# Patient Record
Sex: Female | Born: 1945 | Race: White | Hispanic: No | Marital: Married | Smoking: Never smoker
Health system: Southern US, Community
[De-identification: ages and names within clinical notes are randomized; demographics above are authoritative.]

## PROBLEM LIST (undated history)

## (undated) DIAGNOSIS — M2550 Pain in unspecified joint: Secondary | ICD-10-CM

## (undated) DIAGNOSIS — R351 Nocturia: Secondary | ICD-10-CM

## (undated) DIAGNOSIS — R102 Pelvic and perineal pain unspecified side: Secondary | ICD-10-CM

## (undated) DIAGNOSIS — R350 Frequency of micturition: Secondary | ICD-10-CM

## (undated) DIAGNOSIS — G8929 Other chronic pain: Secondary | ICD-10-CM

## (undated) DIAGNOSIS — M5030 Other cervical disc degeneration, unspecified cervical region: Secondary | ICD-10-CM

## (undated) DIAGNOSIS — I10 Essential (primary) hypertension: Secondary | ICD-10-CM

## (undated) DIAGNOSIS — Z8719 Personal history of other diseases of the digestive system: Secondary | ICD-10-CM

## (undated) DIAGNOSIS — M1990 Unspecified osteoarthritis, unspecified site: Secondary | ICD-10-CM

## (undated) DIAGNOSIS — K589 Irritable bowel syndrome without diarrhea: Secondary | ICD-10-CM

## (undated) DIAGNOSIS — R3915 Urgency of urination: Secondary | ICD-10-CM

## (undated) DIAGNOSIS — K219 Gastro-esophageal reflux disease without esophagitis: Secondary | ICD-10-CM

## (undated) HISTORY — PX: ABDOMINAL HYSTERECTOMY: SHX81

## (undated) HISTORY — PX: TONSILLECTOMY AND ADENOIDECTOMY: SUR1326

## (undated) HISTORY — PX: DILATION AND CURETTAGE OF UTERUS: SHX78

## (undated) HISTORY — PX: CARDIOVASCULAR STRESS TEST: SHX262

## (undated) HISTORY — PX: OTHER SURGICAL HISTORY: SHX169

---

## 1990-12-01 HISTORY — PX: CHOLECYSTECTOMY: SHX55

## 2001-06-18 ENCOUNTER — Encounter: Payer: Self-pay | Admitting: Family Medicine

## 2001-06-18 ENCOUNTER — Encounter: Admission: RE | Admit: 2001-06-18 | Discharge: 2001-06-18 | Payer: Self-pay | Admitting: Family Medicine

## 2002-01-20 ENCOUNTER — Ambulatory Visit (HOSPITAL_COMMUNITY): Admission: RE | Admit: 2002-01-20 | Discharge: 2002-01-20 | Payer: Self-pay | Admitting: Family Medicine

## 2002-06-23 ENCOUNTER — Encounter: Payer: Self-pay | Admitting: Family Medicine

## 2002-06-23 ENCOUNTER — Encounter: Admission: RE | Admit: 2002-06-23 | Discharge: 2002-06-23 | Payer: Self-pay | Admitting: Family Medicine

## 2003-06-26 ENCOUNTER — Encounter: Payer: Self-pay | Admitting: Gastroenterology

## 2003-06-26 ENCOUNTER — Encounter: Admission: RE | Admit: 2003-06-26 | Discharge: 2003-06-26 | Payer: Self-pay | Admitting: Gastroenterology

## 2003-07-11 ENCOUNTER — Ambulatory Visit (HOSPITAL_COMMUNITY): Admission: RE | Admit: 2003-07-11 | Discharge: 2003-07-11 | Payer: Self-pay | Admitting: Gastroenterology

## 2003-07-18 ENCOUNTER — Encounter: Payer: Self-pay | Admitting: Family Medicine

## 2003-07-18 ENCOUNTER — Encounter: Admission: RE | Admit: 2003-07-18 | Discharge: 2003-07-18 | Payer: Self-pay | Admitting: Family Medicine

## 2003-12-21 ENCOUNTER — Other Ambulatory Visit: Admission: RE | Admit: 2003-12-21 | Discharge: 2003-12-21 | Payer: Self-pay | Admitting: Obstetrics & Gynecology

## 2004-01-30 ENCOUNTER — Ambulatory Visit (HOSPITAL_COMMUNITY): Admission: RE | Admit: 2004-01-30 | Discharge: 2004-01-30 | Payer: Self-pay | Admitting: Orthopedic Surgery

## 2004-07-26 ENCOUNTER — Encounter: Admission: RE | Admit: 2004-07-26 | Discharge: 2004-07-26 | Payer: Self-pay | Admitting: Family Medicine

## 2004-09-25 ENCOUNTER — Encounter: Admission: RE | Admit: 2004-09-25 | Discharge: 2004-09-25 | Payer: Self-pay | Admitting: Gastroenterology

## 2005-08-07 ENCOUNTER — Encounter: Admission: RE | Admit: 2005-08-07 | Discharge: 2005-08-07 | Payer: Self-pay | Admitting: Gastroenterology

## 2006-02-24 ENCOUNTER — Encounter: Admission: RE | Admit: 2006-02-24 | Discharge: 2006-02-24 | Payer: Self-pay | Admitting: Family Medicine

## 2007-02-25 ENCOUNTER — Encounter: Admission: RE | Admit: 2007-02-25 | Discharge: 2007-02-25 | Payer: Self-pay | Admitting: Family Medicine

## 2007-08-27 ENCOUNTER — Encounter: Admission: RE | Admit: 2007-08-27 | Discharge: 2007-08-27 | Payer: Self-pay | Admitting: Family Medicine

## 2007-10-22 ENCOUNTER — Encounter: Admission: RE | Admit: 2007-10-22 | Discharge: 2007-10-22 | Payer: Self-pay | Admitting: Family Medicine

## 2007-12-02 HISTORY — PX: BASAL CELL CARCINOMA EXCISION: SHX1214

## 2008-03-29 ENCOUNTER — Encounter: Admission: RE | Admit: 2008-03-29 | Discharge: 2008-03-29 | Payer: Self-pay | Admitting: Family Medicine

## 2008-10-10 ENCOUNTER — Encounter: Admission: RE | Admit: 2008-10-10 | Discharge: 2008-11-29 | Payer: Self-pay | Admitting: Family Medicine

## 2008-12-04 ENCOUNTER — Encounter: Admission: RE | Admit: 2008-12-04 | Discharge: 2008-12-13 | Payer: Self-pay | Admitting: Family Medicine

## 2009-04-09 ENCOUNTER — Encounter: Admission: RE | Admit: 2009-04-09 | Discharge: 2009-04-09 | Payer: Self-pay | Admitting: Family Medicine

## 2009-06-18 ENCOUNTER — Encounter: Admission: RE | Admit: 2009-06-18 | Discharge: 2009-07-25 | Payer: Self-pay | Admitting: Family Medicine

## 2009-06-26 ENCOUNTER — Other Ambulatory Visit: Admission: RE | Admit: 2009-06-26 | Discharge: 2009-06-26 | Payer: Self-pay | Admitting: Family Medicine

## 2009-10-15 IMAGING — CT CT MAXILLOFACIAL W/O CM
1 of 2 series · 16 of 30 positions shown, 20 images · non-contrast
Comparison: None.

MAXILLOFACIAL CT WITHOUT CONTRAST:

CLINICAL DATA: Right maxillary and ear pain.
TECHNIQUE: Axial and coronal plane CT imaging of the maxillofacial structures
was performed including the facial bones, paranasal sinuses, and orbits.  No
intravenous contrast was administered.

[Series 2: sinus prone bone · axial · 0.33mm/px · z∈[-7,+117]mm · 16 of 52 slices shown, 20 images]
[im 3/52  brain]
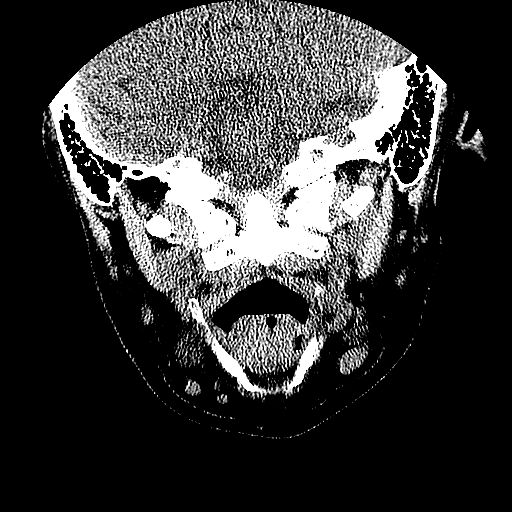
[im 3/52  bone]
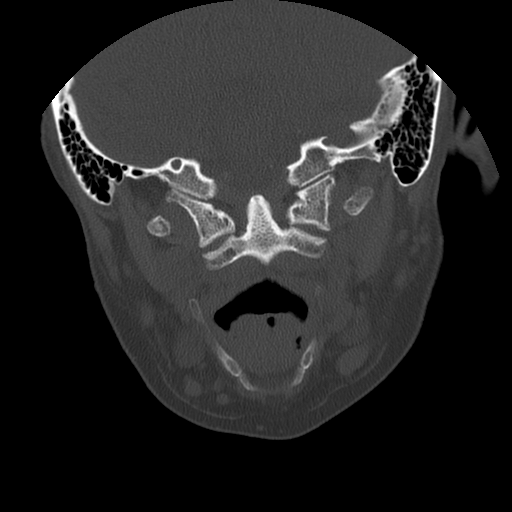
[im 7/52  bone]
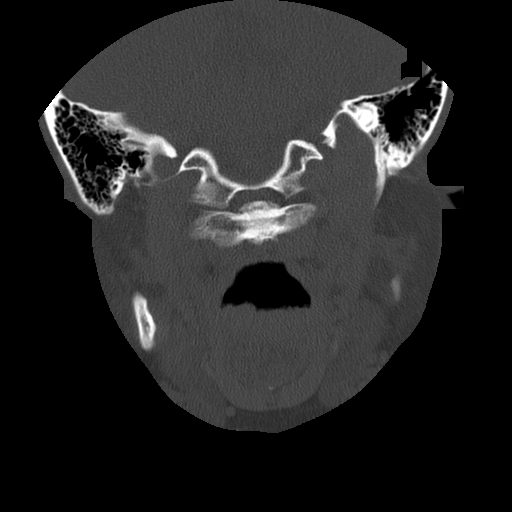
[im 9/52  bone]
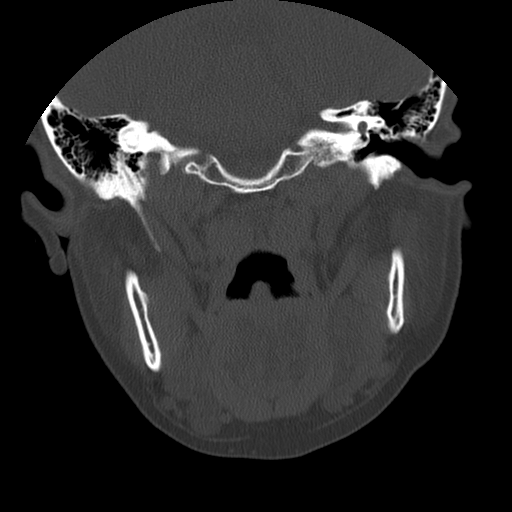
[im 13/52  bone]
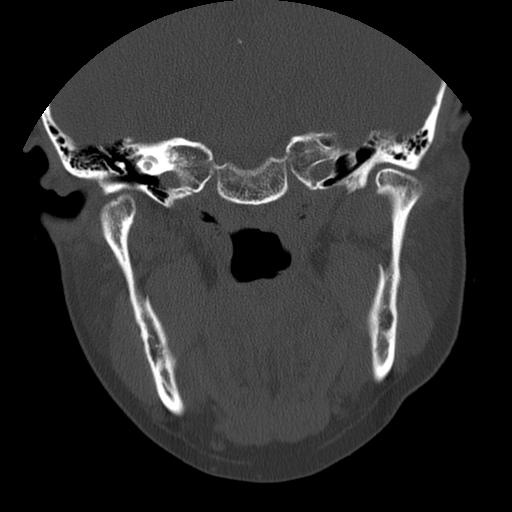
[im 15/52  brain]
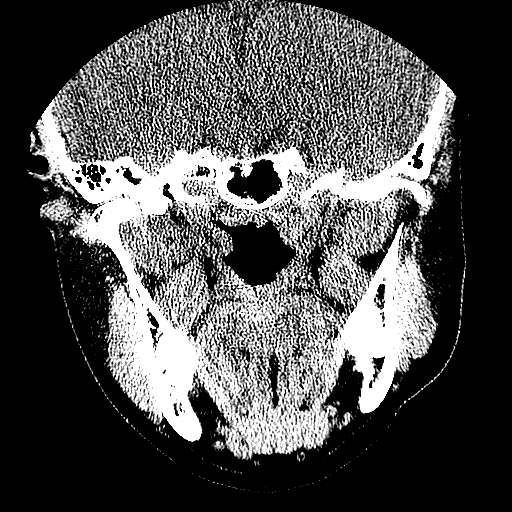
[im 15/52  bone]
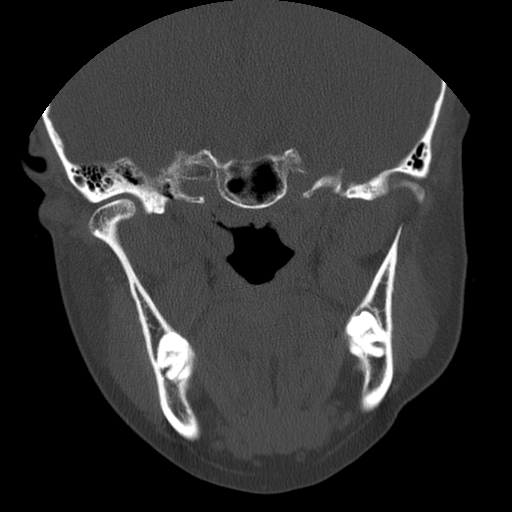
[im 19/52  bone]
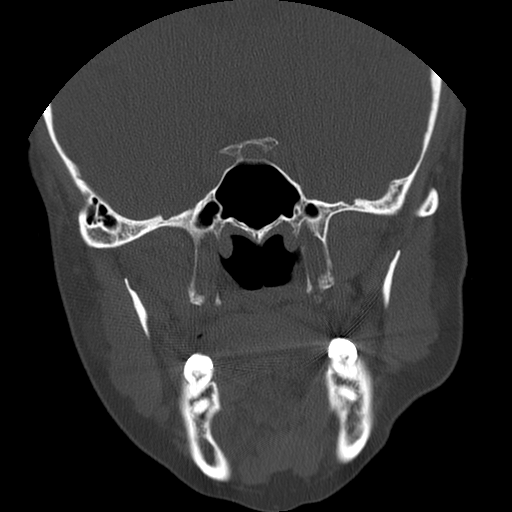
[im 21/52  bone]
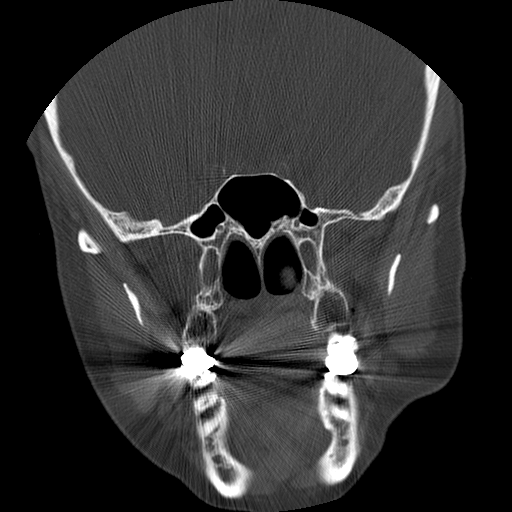
[im 25/52  bone]
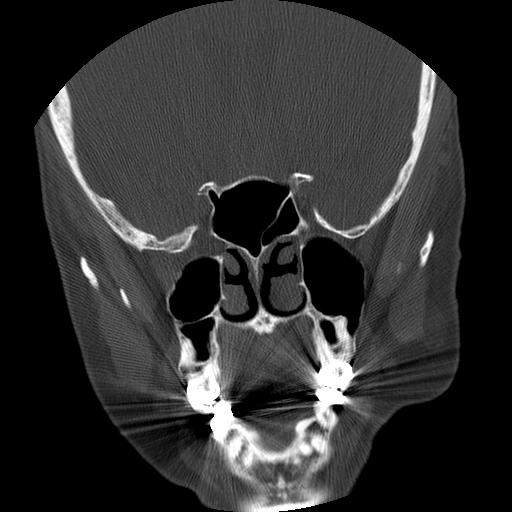
[im 27/52  brain]
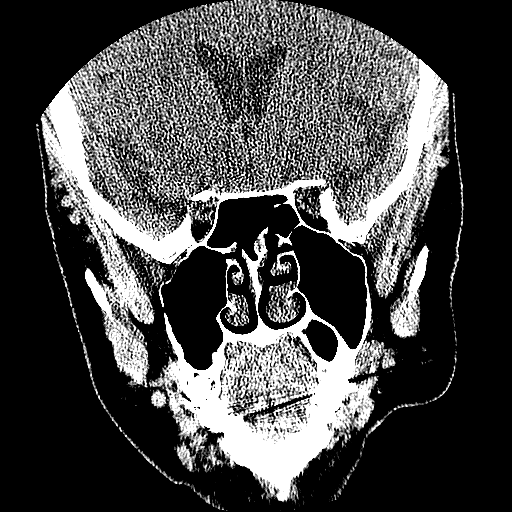
[im 27/52  bone]
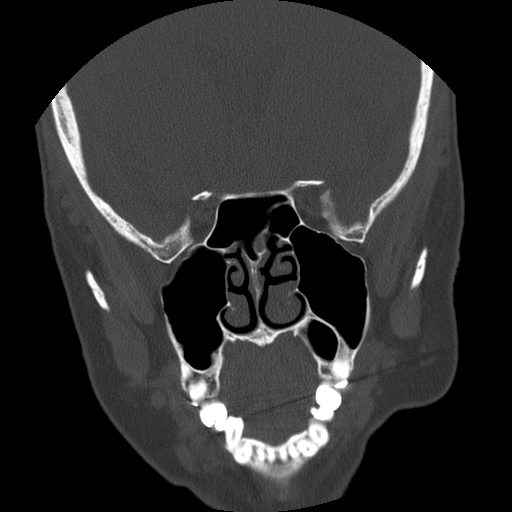
[im 31/52  bone]
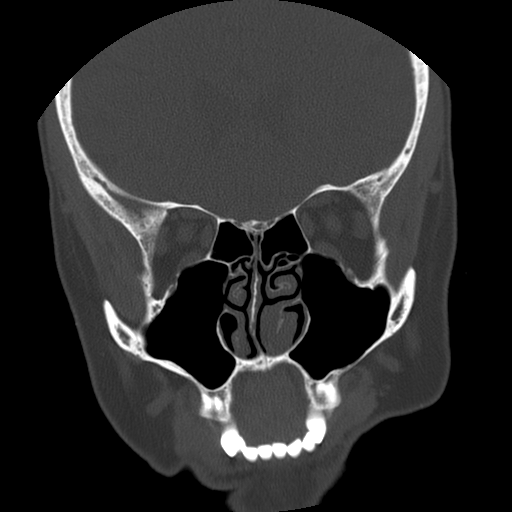
[im 33/52  bone]
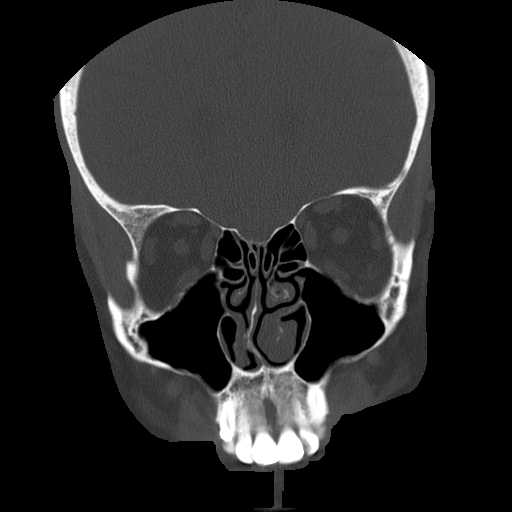
[im 37/52  bone]
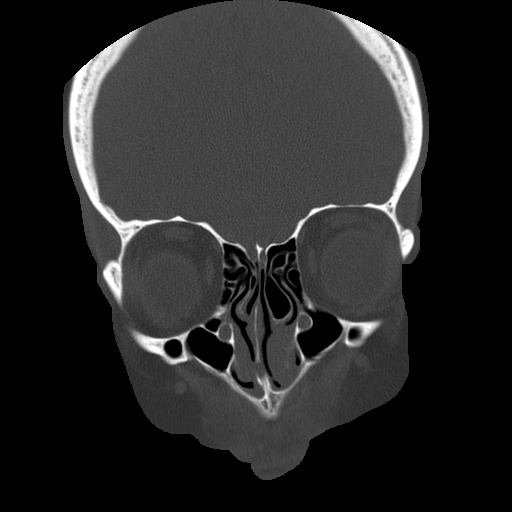
[im 39/52  brain]
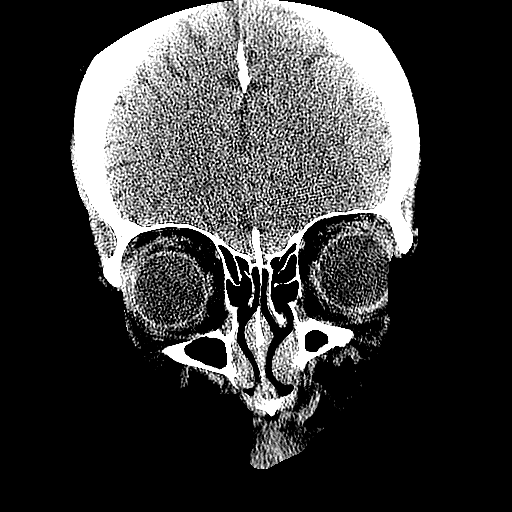
[im 39/52  bone]
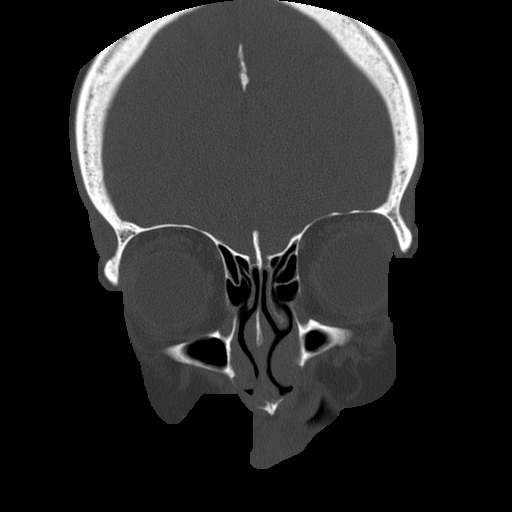
[im 43/52  bone]
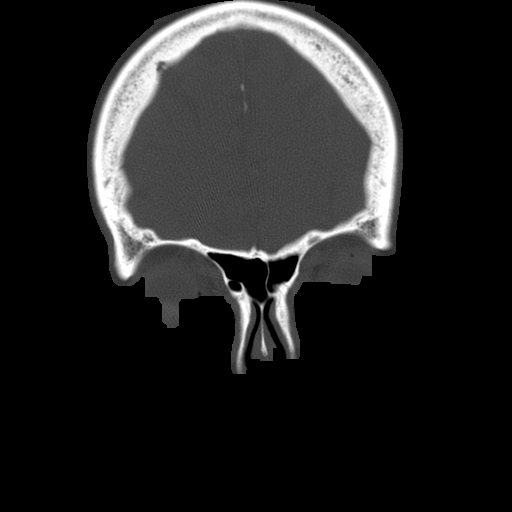
[im 45/52  bone]
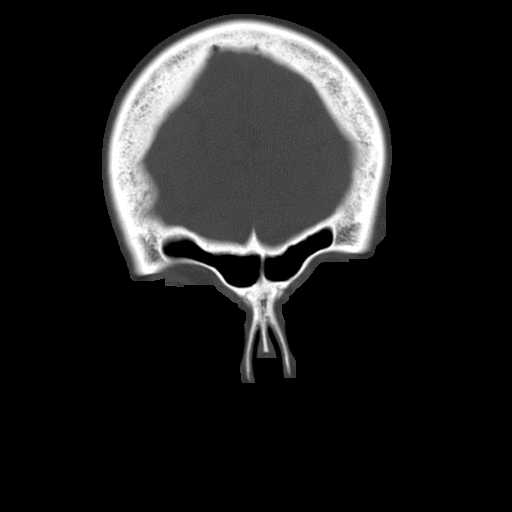
[im 49/52  bone]
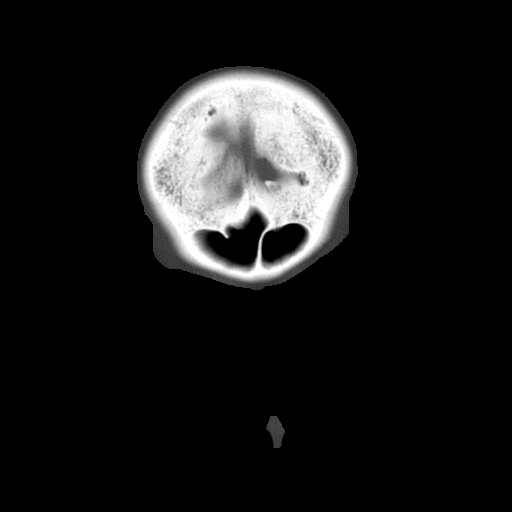

[16 of 30 positions shown; findings below may reference images not displayed]

FINDINGS: The sphenoid sinuses, maxillary sinuses, ethmoid air cells, and
frontal sinuses are clear. The visualized portions of the mastoid air cells are
without evidence for effusion. There is no fluid within the middle ears. The
temporomandibular joints are located bilaterally. A few small scattered
submental and submandibular lymph nodes are evident.
IMPRESSION: No evidence for paranasal sinus disease.

## 2010-03-01 ENCOUNTER — Encounter (INDEPENDENT_AMBULATORY_CARE_PROVIDER_SITE_OTHER): Payer: Self-pay | Admitting: Internal Medicine

## 2010-03-01 ENCOUNTER — Ambulatory Visit: Payer: Self-pay | Admitting: Cardiology

## 2010-03-01 ENCOUNTER — Observation Stay (HOSPITAL_COMMUNITY): Admission: EM | Admit: 2010-03-01 | Discharge: 2010-03-02 | Payer: Self-pay | Admitting: Emergency Medicine

## 2010-03-01 HISTORY — PX: TRANSTHORACIC ECHOCARDIOGRAM: SHX275

## 2010-03-07 ENCOUNTER — Telehealth (INDEPENDENT_AMBULATORY_CARE_PROVIDER_SITE_OTHER): Payer: Self-pay | Admitting: *Deleted

## 2010-03-11 ENCOUNTER — Encounter (HOSPITAL_COMMUNITY): Admission: RE | Admit: 2010-03-11 | Discharge: 2010-05-02 | Payer: Self-pay | Admitting: Cardiology

## 2010-03-11 ENCOUNTER — Ambulatory Visit: Payer: Self-pay | Admitting: Internal Medicine

## 2010-03-11 ENCOUNTER — Ambulatory Visit: Payer: Self-pay

## 2010-03-18 DIAGNOSIS — R079 Chest pain, unspecified: Secondary | ICD-10-CM

## 2010-03-18 DIAGNOSIS — I10 Essential (primary) hypertension: Secondary | ICD-10-CM | POA: Insufficient documentation

## 2010-03-20 ENCOUNTER — Encounter: Payer: Self-pay | Admitting: Cardiology

## 2010-03-21 ENCOUNTER — Ambulatory Visit: Payer: Self-pay | Admitting: Cardiology

## 2010-04-03 ENCOUNTER — Encounter: Admission: RE | Admit: 2010-04-03 | Discharge: 2010-05-29 | Payer: Self-pay | Admitting: Orthopedic Surgery

## 2010-07-12 ENCOUNTER — Encounter: Admission: RE | Admit: 2010-07-12 | Discharge: 2010-07-12 | Payer: Self-pay | Admitting: Family Medicine

## 2010-07-31 ENCOUNTER — Encounter: Admission: RE | Admit: 2010-07-31 | Discharge: 2010-07-31 | Payer: Self-pay | Admitting: Gastroenterology

## 2011-01-02 NOTE — Assessment & Plan Note (Signed)
Summary: Cardiology Nuclear Study  Nuclear Med Background Indications for Stress Test: Evaluation for Ischemia, Post Hospital  Indications Comments: 03/02/10 Chest Pain: MI R/O  History: Echo  History Comments: 03/01/10 Echo: EF=55-60%  Symptoms: Chest Pain, Chest Tightness with Exertion    Nuclear Pre-Procedure Cardiac Risk Factors: Hypertension Caffeine/Decaff Intake: None NPO After: 12:00 AM Lungs: clear IV 0.9% NS with Angio Cath: 22g     IV Site: (R) AC IV Started by: Irean Hong RN Chest Size (in) 38     Cup Size B     Height (in): 61.5 Weight (lb): 142 BMI: 26.49 Tech Comments: This patient had chest pressure and radiating pain to the (L) shoulder 5/10 at the worst.  Nuclear Med Study 1 or 2 day study:  1 day     Stress Test Type:  Eugenie Birks Reading MD:  Dietrich Pates, MD     Referring MD:  J.Katz Resting Radionuclide:  Technetium 75m Tetrofosmin     Resting Radionuclide Dose:  11.0 mCi  Stress Radionuclide:  Technetium 35m Tetrofosmin     Stress Radionuclide Dose:  33.0 mCi   Stress Protocol   Lexiscan: 0.4 mg   Stress Test Technologist:  Milana Na EMT-P     Nuclear Technologist:  Domenic Polite CNMT  Rest Procedure  Myocardial perfusion imaging was performed at rest 45 minutes following the intravenous administration of Myoview Technetium 75m Tetrofosmin.  Stress Procedure  The patient received IV Lexiscan 0.4 mg over 15-seconds.  Myoview injected at 30-seconds.  There were no significant changes with infusion.  Quantitative spect images were obtained after a 45 minute delay.  QPS Raw Data Images:  Images were motion corrected.  Soft tissue (diaphragm, breast tissue) surround heart. Stress Images:  There is normal uptake in all areas. Rest Images:  Normal homogeneous uptake in all areas of the myocardium. Subtraction (SDS):  No evidence of ischemia. Transient Ischemic Dilatation:  .72  (Normal <1.22)  Lung/Heart Ratio:  .47  (Normal  <0.45)  Quantitative Gated Spect Images QGS EDV:  34 ml QGS ESV:  6 ml QGS EF:  83 %   Overall Impression  Exercise Capacity: Lexiscan protocol. BP Response: Normal blood pressure response. Clinical Symptoms: Moderate chest pressure. ECG Impression: No significant ST segment change suggestive of ischemia. Overall Impression: Normal stress nuclear study.  Appended Document: Cardiology Nuclear Study Is there an office visit planned?  Appended Document: Cardiology Nuclear Study pt has appt 4/21

## 2011-01-02 NOTE — Miscellaneous (Signed)
  Clinical Lists Changes  Problems: Added new problem of * ARM NUMBNESS Observations: Added new observation of PAST MED HX: Hiatal hernia CHEST PAIN (ICD-786.50)  hospital 02/28/2010...no MI. /  nuclear outpatient..03/11/2010..normal...EF  70% HYPERTENSION (ICD-401.9) Arm numbness (left)..consider cervical spine ) EF 55-60%  echo..03/01/2010 (03/20/2010 11:31) Added new observation of PRIMARY MD: Carolin Coy, MD (03/20/2010 11:31)       Past History:  Past Medical History: Hiatal hernia CHEST PAIN (ICD-786.50)  hospital 02/28/2010...no MI. /  nuclear outpatient..03/11/2010..normal...EF  70% HYPERTENSION (ICD-401.9) Arm numbness (left)..consider cervical spine ) EF 55-60%  echo..03/01/2010

## 2011-01-02 NOTE — Progress Notes (Signed)
Summary: Nuclear Pre-Procedure  Phone Note Outgoing Call Call back at [facility] Phone [phone]   Call placed by: Stanton Kidney, EMT-P,  March 07, 2010 3:19 PM Call placed to: Patient Action Taken: Phone Call Completed Summary of Call: Reviewed information on Myoview Information Sheet (see scanned document for further details).  Spoke with Patient     Nuclear Med Background Indications for Stress Test: Evaluation for Ischemia, Post Hospital  Indications Comments: 03/02/10 Chest Pain: MI R/O  History: Echo  History Comments: 03/01/10 Echo: EF=55-60%  Symptoms: Chest Pain, Chest Tightness with Exertion    Nuclear Pre-Procedure Cardiac Risk Factors: Hypertension

## 2011-01-02 NOTE — Assessment & Plan Note (Signed)
Summary: EPH/PER NIGHT MESSAGE/POST MYOVIEW/LG   Visit Type:  post hospital Primary Provider:  Hall Busing, MD  CC:  chest pain.  History of Present Illness: The patient had been admitted to the hospital with some chest discomfort.  This occurred on February 28, 2010.  There was no evidence of myocardial injury.  Patient was discharged home with plans for an outpatient exercise test.  She had this study on March 11, 2010 she had a [employer]..  The study was normal.  There was no sign of ischemia.  She had had an echo in the hospital showing an ejection fraction of 60% with no valvular abnormalities.  There was question in the hospital that she might have some disc disease.  As being at home she's had some difficulties with pain in her back that radiates down her leg and she has been seen by orthopedics and she is on prednisone.  Current Medications (verified): 1)  Vasotec 20 Mg Tabs (Enalapril Maleate) .... Take 1 Tablet By Mouth Once A Day 2)  Hydrochlorothiazide 25 Mg Tabs (Hydrochlorothiazide) .... Take One Tablet By Mouth Daily. 3)  Nexium 40 Mg Cpdr (Esomeprazole Magnesium) .... Take 1 Capsule By Mouth Once A Day 4)  Aspirin 81 Mg Tbec (Aspirin) .... Take One Tablet By Mouth Daily 5)  Calcium Carbonate-Vitamin D 600-400 Mg-Unit  Tabs (Calcium Carbonate-Vitamin D) .... Take 1 Tablet By Mouth Once A Day 6)  Daily Multi  Tabs (Multiple Vitamins-Minerals) .... Take 1 Tablet By Mouth Once A Day 7)  Glucosamine Chondr 1500 Complx  Caps (Glucosamine-Chondroit-Vit C-Mn) .... Take 1 Capsule By Mouth Once A Day 8)  Ambien 10 Mg Tabs (Zolpidem Tartrate) .Marland Kitchen.. 1 or 1/2 Tablet As Needed 9)  Estrace 0.1 Mg/gm Crea (Estradiol) .... Once A Week 10)  Prednisone 10 Mg Tabs (Prednisone) .... Take As Directed  Allergies: 1)  ! Tetracycline 2)  ! * Azithromycin 3)  ! Amoxicillin 4)  ! Morphine 5)  ! * Levofloxacin 6)  ! * Benzonatate 7)  ! * Guaifenesin 8)  ! Codeine 9)  ! * Imipramine 10)   ! Synthroid 11)  ! Ceftin 12)  ! * Tessalon 13)  ! Caffeine  Past History:  Past Medical History: Last updated: 03/20/2010 Hiatal hernia CHEST PAIN (ICD-786.50)  hospital 02/28/2010...no MI. /  nuclear outpatient..03/11/2010..normal...EF  70% HYPERTENSION (ICD-401.9) Arm numbness (left)..consider cervical spine ) EF 55-60%  echo..03/01/2010  Review of Systems       Patient denies fever, chills, headache, sweats, rash, change in vision, change in hearing, chest pain, cough, nausea vomiting, urinary symptoms.  All other systems are reviewed and are negative.  Vital Signs:  Patient profile:   65 year old female Height:      61.5 inches Weight:      146.25 pounds BMI:     27.28 Pulse rate:   88 / minute Pulse rhythm:   regular Resp:     18 per minute BP sitting:   110 / 70  (left arm) Cuff size:   regular  Vitals Entered By: Vikki Ports (March 21, 2010 2:17 PM)  Physical Exam  General:  patient is stable today. Eyes:  no xanthelasma. Neck:  no jugular venous distention. Lungs:  lungs are clear.  Respiratory effort is nonlabored. Heart:  cardiac exam reveals S1 and S2.  No clicks or significant murmurs. Abdomen:  abdomen is soft. Extremities:  no peripheral edema. Psych:  patient is oriented to person time and place.  Affect  is normal.   Impression & Recommendations:  Problem # 1:  * ARM NUMBNESS The patient may have some disc disease.  This is being evaluated for both arm discomfort and leg discomfort  Problem # 2:  CHEST PAIN (ICD-786.50)  Her updated medication list for this problem includes:    Vasotec 20 Mg Tabs (Enalapril maleate) .Marland Kitchen... Take 1 tablet by mouth once a day    Aspirin 81 Mg Tbec (Aspirin) .Marland Kitchen... Take one tablet by mouth daily Nuclear study shows no evidence of ischemic disease.  No further cardiac workup is needed.  Problem # 3:  HYPERTENSION (ICD-401.9)  Her updated medication list for this problem includes:    Vasotec 20 Mg Tabs (Enalapril  maleate) .Marland Kitchen... Take 1 tablet by mouth once a day    Hydrochlorothiazide 25 Mg Tabs (Hydrochlorothiazide) .Marland Kitchen... Take one tablet by mouth daily.    Aspirin 81 Mg Tbec (Aspirin) .Marland Kitchen... Take one tablet by mouth daily Blood pressure is under good control.  No further workup needed.

## 2011-02-19 LAB — COMPREHENSIVE METABOLIC PANEL
AST: 26 U/L (ref 0–37)
Albumin: 3.7 g/dL (ref 3.5–5.2)
Alkaline Phosphatase: 42 U/L (ref 39–117)
Creatinine, Ser: 0.78 mg/dL (ref 0.4–1.2)
GFR calc Af Amer: >60 mL/min (ref >60)
Glucose, Bld: 91 mg/dL (ref 70–99)

## 2011-02-19 LAB — CBC
HCT: 34.1 % — ABNORMAL LOW (ref 36.0–46.0)
Hemoglobin: 11.7 g/dL — ABNORMAL LOW (ref 12.0–15.0)
MCHC: 34.3 g/dL (ref 30.0–36.0)
MCV: 85.2 fL (ref 78.0–100.0)
Platelets: 242 10*3/uL (ref 150–400)
RBC: 4.01 MIL/uL (ref 3.87–5.11)
RDW: 13.4 % (ref 11.5–15.5)
WBC: 6.9 10*3/uL (ref 4.0–10.5)

## 2011-02-19 LAB — CARDIAC PANEL(CRET KIN+CKTOT+MB+TROPI)
CK, MB: 1.3 ng/mL (ref 0.3–4.0)
Relative Index: INVALID (ref 0.0–2.5)
Total CK: 57 U/L (ref 7–177)
Troponin I: <0.01 ng/mL (ref 0.00–0.06)
Troponin I: <0.01 ng/mL (ref 0.00–0.06)

## 2011-02-19 LAB — POCT CARDIAC MARKERS

## 2011-02-24 LAB — URINALYSIS, ROUTINE W REFLEX MICROSCOPIC
Bilirubin Urine: NEGATIVE
Glucose, UA: NEGATIVE mg/dL
Ketones, ur: NEGATIVE mg/dL
Nitrite: NEGATIVE
Protein, ur: NEGATIVE mg/dL
Specific Gravity, Urine: 1.01 (ref 1.005–1.030)
Urobilinogen, UA: 0.2 mg/dL (ref 0.0–1.0)

## 2011-02-24 LAB — POCT CARDIAC MARKERS
CKMB, poc: 1 ng/mL (ref 1.0–8.0)
Myoglobin, poc: 81.7 ng/mL (ref 12–200)

## 2011-02-24 LAB — HEPATIC FUNCTION PANEL
AST: 31 U/L (ref 0–37)
Albumin: 4.2 g/dL (ref 3.5–5.2)
Total Bilirubin: 0.6 mg/dL (ref 0.3–1.2)
Total Protein: 7.9 g/dL (ref 6.0–8.3)

## 2011-02-24 LAB — DIFFERENTIAL
Basophils Absolute: 0 10*3/uL (ref 0.0–0.1)
Basophils Relative: 1 % (ref 0–1)
Eosinophils Absolute: 0.1 10*3/uL (ref 0.0–0.7)
Eosinophils Relative: 1 % (ref 0–5)
Lymphocytes Relative: 35 % (ref 12–46)
Monocytes Absolute: 0.6 10*3/uL (ref 0.1–1.0)
Monocytes Relative: 7 % (ref 3–12)

## 2011-02-24 LAB — CBC
Hemoglobin: 12.8 g/dL (ref 12.0–15.0)
RBC: 4.43 MIL/uL (ref 3.87–5.11)
WBC: 8.6 10*3/uL (ref 4.0–10.5)

## 2011-02-24 LAB — D-DIMER, QUANTITATIVE: D-Dimer, Quant: 0.28 ug/mL-FEU (ref 0.00–0.48)

## 2011-02-24 LAB — POCT I-STAT, CHEM 8
Creatinine, Ser: 0.9 mg/dL (ref 0.4–1.2)
HCT: 40 % (ref 36.0–46.0)
Hemoglobin: 13.6 g/dL (ref 12.0–15.0)
Potassium: 3.8 mEq/L (ref 3.5–5.1)
Sodium: 134 mEq/L — ABNORMAL LOW (ref 135–145)

## 2011-02-24 LAB — LIPASE, BLOOD: Lipase: 33 U/L (ref 11–59)

## 2011-02-24 LAB — URINE CULTURE

## 2011-04-18 NOTE — Op Note (Signed)
   NAME:  Tamara Aguilar, Tamara Aguilar                           ACCOUNT NO.:  [id number]   MEDICAL RECORD NO.:  [id number]                   PATIENT TYPE:  AMB   LOCATION:  ENDO                                 FACILITY[facility]  [facility]   PHYSICIAN:  James L. Malon Kindle., M.D.          DATE OF BIRTH:  11/29/1946   DATE OF PROCEDURE:  07/11/2003  DATE OF DISCHARGE:                                 OPERATIVE REPORT   PROCEDURE:  Esophagogastroduodenoscopy.   MEDICATIONS:  Fentanyl 75 mcg, Versed 7 mg.   INDICATIONS FOR PROCEDURE:  A patient with continued upper abdominal pain.  Upper GI showed a large hiatal hernia. It is unclear if there was a  paraesophageal component or not. Due to her continuing symptoms, failure to  respond, endoscopy is performed.   DESCRIPTION OF PROCEDURE:  The procedure had been explained to the patient  and consent obtained. With the patient in the left lateral decubitus  position, the Olympus scope was inserted and advanced. The stomach was  entered, pylorus identified and passed. The duodenum including the bulb and  second portion were seen well. There was no inflammation or ulceration. The  scope was withdrawn back in the stomach, the antrum was normal. The scope  was placed in the retroflexed view. Again the patient was seen to have a  massive hiatal hernia. This was documented photographically. Upon  withdrawal, the diaphragm was seen at 34 cm and the Z line at 30 cm. It was  just 4-5 cm in length. The diaphragm was widely patent. It is estimated  approximately a third of the stomach was up in the chest. I could not  demonstrate in endoscopy a paraesophageal component. The proximal and distal  esophagus were endoscopically normal upon withdrawal of the scope. The scope  was withdrawn. The patient tolerated the procedure well.   ASSESSMENT:  Large hiatal hernia with a partially intrathoracic stomach.   PLAN:  Will discuss surgical options with her, keep her on Aciphex now.  Proceed with colonoscopy as planned.                                               James L. Malon Kindle., M.D.    Waldron Session  D:  07/11/2003  T:  07/11/2003  Job:  [id number]   cc:   Otilio Connors. Gerri Spore, M.D.  [address]  [place]  [place] [address]  Fax: [phone]

## 2012-06-28 ENCOUNTER — Other Ambulatory Visit: Payer: Self-pay | Admitting: Urology

## 2012-07-01 ENCOUNTER — Encounter (HOSPITAL_BASED_OUTPATIENT_CLINIC_OR_DEPARTMENT_OTHER): Payer: Self-pay | Admitting: *Deleted

## 2012-07-01 NOTE — Progress Notes (Signed)
NPO AFTER MN. ARRIVES AT 0645. NEEDS ISTAT AND EKG. WILL TAKE TRAMADOL, TYLENOL ARTHRITIS AND NEXIUM AM OF SURG W/ SIPS OF WATER.

## 2012-07-07 NOTE — H&P (Signed)
History of Present Illness   [name] is a new patient originally followed by Dr. Aldean Aguilar. The last few months she was having right lower quadrant suprapubic discomfort that is quite steady. It may be relieved some when she voids. AZO Standard twice a day helped her void less frequently and helps the discomfort. She is not having dysuria. She was checked for a urine infection and she did not have one.  She has uncommon urge incontinence not normally wearing pads. She voids every hour but will take AZO Standard to try to hold it longer. She gets up 3-4 times a night and reports a poor flow.  She used to get urinary tract infections. She has had a bladder suspension and hysterectomy. She denies a history of kidney stones. She has no neurologic risk factors or symptoms. She has alternating diarrhea and constipation from irritable bowel syndrome.   Her symptoms are mild to moderate in severity and persisting.    Past Medical History Problems  1. History of  Anxiety (Symptom) 300.00 2. History of  Arthritis V13.4 3. History of  Depression 311 4. History of  Heartburn 787.1 5. History of  Hypercholesterolemia 272.0 6. History of  Hypertension 401.9 7. History of  Skin Cancer V10.83  Surgical History Problems  1. History of  Cholecystectomy 2. History of  Dilation And Curettage 3. History of  Hysterectomy V45.77 4. History of  Oophorectomy - Bilateral (Removal Of Both Ovaries) 5. History of  Tonsillectomy  Current Meds 1. Ambien TABS; Therapy: (Recorded:02Oct2008) to 2. Aspirin Low Dose 81 MG Oral Tablet; Therapy: (Recorded:02Oct2008) to 3. Calcium + D TABS; Therapy: (Recorded:02Oct2008) to 4. Estrace 0.1 MG/GM Vaginal Cream; Therapy: (Recorded:02Oct2008) to 5. Fluticasone Propionate 50 MCG/ACT Nasal Suspension; Therapy: 02Jul2012 to 6. Glucosamine Chondroitin TABS; Therapy: (Recorded:02Oct2008) to 7. Hydrochlorothiazide 25 MG Oral Tablet; Therapy: 12Apr2013 to 8.  Hydrocodone-Acetaminophen 5-500 MG Oral Tablet; Therapy: 01Apr2013 to 9. Lexapro TABS; Therapy: (Recorded:02Oct2008) to 10. NexIUM 40 MG Oral Capsule Delayed Release; Therapy: 29Nov2012 to 11. TraMADol HCl 50 MG Oral Tablet; Therapy: 05Feb2013 to 12. Vasotec 20 MG Oral Tablet; Therapy: (Recorded:02Oct2008) to 13. Voltaren 1 % Transdermal Gel; Therapy: 26Jun2013 to 14. Zolpidem Tartrate 10 MG Oral Tablet; Therapy: 30Jan2013 to  Allergies Medication  1. Acetaminophen TABS 2. Amoxicillin TABS 3. Azithromycin TABS 4. Caffeine TABS 5. Ceftin TABS 6. Guaifenesin & Derivatives 7. Imipramine HCl TABS 8. Levaquin TABS 9. Morphine Derivatives 10. Synthroid TABS 11. Tessalon Perles CAPS 12. Tetracyclines  Family History Problems  1. Maternal history of  Acute Myocardial Infarction V17.3 2. Family history of  Death In The Family Father 3. Family history of  Family Health Status Number Of Children 1 son & 1 daughter 4. Maternal history of  Hematuria 5. Paternal history of  Prostate Cancer V16.42  Social History Problems    Caffeine Use 1 per day   Marital History - Currently Married   Never A Smoker   Occupation: admin. assoc advanced- [employer] Denied    History of  Alcohol Use   History of  Tobacco Use V15.82  Review of Systems Constitutional, skin, eye, cardiovascular, pulmonary, endocrine, musculoskeletal, neurological and psychiatric system(s) were reviewed and pertinent findings if present are noted.  Genitourinary: urinary frequency.  Gastrointestinal: abdominal pain, diarrhea and constipation.  ENT: sinus problems.  Hematologic/Lymphatic: a tendency to easily bruise.    Vitals Vital Signs [Data Includes: Last 1 Day]  29Jul2013 08:24AM  BMI Calculated: 27.82 BSA Calculated: 1.58 Height: 4 ft 11 in Weight:  138 lb  Blood Pressure: 127 / 81 Temperature: 97.8 F Heart Rate: 77  Physical Exam Constitutional: Well nourished and well developed . No acute distress.    ENT:. The ears and nose are normal in appearance.  Neck: The appearance of the neck is normal and no neck mass is present.  Pulmonary: No respiratory distress and normal respiratory rhythm and effort.  Cardiovascular: Heart rate and rhythm are normal . No peripheral edema.  Abdomen: The abdomen is soft and nontender. No masses are palpated. No CVA tenderness. No hernias are palpable. No hepatosplenomegaly noted.  Lymphatics: The femoral and inguinal nodes are not enlarged or tender.  Skin: Normal skin turgor, no visible rash and no visible skin lesions.  Neuro/Psych:. Mood and affect are appropriate.   . Genitourinary: On pelvic examination [name] had a grade 1 cystocele.  She had no stress incontinence.  Right levator muscles and bladder were minimally tender.  Left levator muscles were nontender.  She had whitish discharge which she says is normal for her.     Results/Data    Today she underwent a number of tests which I personally reviewed. Urinalysis: Negative.  Bladder scan: Bladder scan residual was 48 mL. Urine [Data Includes: Last 1 Day]   29Jul2013  COLOR YELLOW   APPEARANCE CLEAR   SPECIFIC GRAVITY 1.015   pH 6.0   GLUCOSE NEG mg/dL  BILIRUBIN NEG   KETONE NEG mg/dL  BLOOD NEG   PROTEIN NEG mg/dL  UROBILINOGEN 0.2 mg/dL  NITRITE NEG   LEUKOCYTE ESTERASE NEG    Assessment Assessed  1. Chronic Cystitis 595.2 2. Urge Incontinence Of Urine 788.31  Plan   Discussion/Summary   [name] has nonspecific suprapubic and right lower quadrant pain. She has frequency and nocturia. She has decreased flow. AZO Standard helped some of her symptoms. She has been followed by Dr. Aldean Aguilar in the past for urinary tract infections. She has mild urge incontinence not normally wearing pads. She understands she could have interstitial cystitis. I discussed a hydrodistension with her.   We talked about cystoscopy/hydrodistension and instillation in detail. Pros, cons, general  surgical and anesthetic risks, and other options including watchful waiting were discussed. Risks were described but not limited to pain, infection, and bleeding. The risk of bladder perforation and management were discussed. The patient understands that it is primarily a diagnostic procedure.   Ms. Willow uses Estrace cream once a week for vaginal dryness. She saw Dr. Clementeen Graham in [facility] and was hydrodistended 3 years ago to look for interstitial cystitis. She would like to proceed with a hydrodistension.  After a thorough review of the management options for the patient's condition the patient  elected to proceed with surgical therapy as noted above. We have discussed the potential benefits and risks of the procedure, side effects of the proposed treatment, the likelihood of the patient achieving the goals of the procedure, and any potential problems that might occur during the procedure or recuperation. Informed consent has been obtained.

## 2012-07-08 ENCOUNTER — Encounter (HOSPITAL_BASED_OUTPATIENT_CLINIC_OR_DEPARTMENT_OTHER)
Admission: RE | Disposition: A | Discharge status: Home / Self Care | Payer: Self-pay | Source: Ambulatory Visit | Attending: Urology

## 2012-07-08 ENCOUNTER — Ambulatory Visit (HOSPITAL_BASED_OUTPATIENT_CLINIC_OR_DEPARTMENT_OTHER)
Admission: RE | Admit: 2012-07-08 | Discharge: 2012-07-08 | Disposition: A | Discharge status: Home / Self Care | Payer: BC Managed Care – PPO | Source: Ambulatory Visit | Attending: Urology | Admitting: Urology

## 2012-07-08 ENCOUNTER — Ambulatory Visit (HOSPITAL_BASED_OUTPATIENT_CLINIC_OR_DEPARTMENT_OTHER): Payer: BC Managed Care – PPO | Admitting: Anesthesiology

## 2012-07-08 ENCOUNTER — Encounter (HOSPITAL_BASED_OUTPATIENT_CLINIC_OR_DEPARTMENT_OTHER): Payer: Self-pay | Admitting: *Deleted

## 2012-07-08 ENCOUNTER — Other Ambulatory Visit: Payer: Self-pay

## 2012-07-08 ENCOUNTER — Encounter (HOSPITAL_BASED_OUTPATIENT_CLINIC_OR_DEPARTMENT_OTHER): Payer: Self-pay | Admitting: Anesthesiology

## 2012-07-08 DIAGNOSIS — I10 Essential (primary) hypertension: Secondary | ICD-10-CM | POA: Insufficient documentation

## 2012-07-08 DIAGNOSIS — E7800 Pure hypercholesterolemia, unspecified: Secondary | ICD-10-CM | POA: Insufficient documentation

## 2012-07-08 DIAGNOSIS — Z85828 Personal history of other malignant neoplasm of skin: Secondary | ICD-10-CM | POA: Insufficient documentation

## 2012-07-08 DIAGNOSIS — N3020 Other chronic cystitis without hematuria: Secondary | ICD-10-CM | POA: Insufficient documentation

## 2012-07-08 DIAGNOSIS — N949 Unspecified condition associated with female genital organs and menstrual cycle: Secondary | ICD-10-CM | POA: Insufficient documentation

## 2012-07-08 DIAGNOSIS — Z79899 Other long term (current) drug therapy: Secondary | ICD-10-CM | POA: Insufficient documentation

## 2012-07-08 DIAGNOSIS — N3941 Urge incontinence: Secondary | ICD-10-CM | POA: Insufficient documentation

## 2012-07-08 HISTORY — PX: CYSTO WITH HYDRODISTENSION: SHX5453

## 2012-07-08 HISTORY — DX: Other cervical disc degeneration, unspecified cervical region: M50.30

## 2012-07-08 HISTORY — DX: Frequency of micturition: R35.0

## 2012-07-08 HISTORY — DX: Urgency of urination: R39.15

## 2012-07-08 HISTORY — DX: Irritable bowel syndrome, unspecified: K58.9

## 2012-07-08 HISTORY — DX: Personal history of other diseases of the digestive system: Z87.19

## 2012-07-08 HISTORY — DX: Unspecified osteoarthritis, unspecified site: M19.90

## 2012-07-08 HISTORY — DX: Nocturia: R35.1

## 2012-07-08 HISTORY — DX: Pelvic and perineal pain: R10.2

## 2012-07-08 HISTORY — DX: Pain in unspecified joint: M25.50

## 2012-07-08 HISTORY — DX: Essential (primary) hypertension: I10

## 2012-07-08 HISTORY — DX: Other chronic pain: G89.29

## 2012-07-08 HISTORY — DX: Gastro-esophageal reflux disease without esophagitis: K21.9

## 2012-07-08 HISTORY — DX: Pelvic and perineal pain unspecified side: R10.20

## 2012-07-08 LAB — POCT I-STAT 4, (NA,K, GLUC, HGB,HCT)
Hemoglobin: 13.3 g/dL (ref 12.0–15.0)
Potassium: 3.7 mEq/L (ref 3.5–5.1)

## 2012-07-08 SURGERY — CYSTOSCOPY, WITH BLADDER HYDRODISTENSION
Anesthesia: Monitor Anesthesia Care | Site: Bladder | Wound class: Clean Contaminated

## 2012-07-08 MED ORDER — GENTAMICIN IN SALINE 1.6-0.9 MG/ML-% IV SOLN
80.0000 mg | INTRAVENOUS | Status: AC
  Administered 2012-07-08: 80 mg via INTRAVENOUS

## 2012-07-08 MED ORDER — PROPOFOL 10 MG/ML IV EMUL
INTRAVENOUS | Status: DC | PRN
  Administered 2012-07-08: 130 mg via INTRAVENOUS

## 2012-07-08 MED ORDER — MIDAZOLAM HCL 5 MG/5ML IJ SOLN
INTRAMUSCULAR | Status: DC | PRN
  Administered 2012-07-08: 1 mg via INTRAVENOUS

## 2012-07-08 MED ORDER — FENTANYL CITRATE 0.05 MG/ML IJ SOLN: 25.0000 ug | INTRAMUSCULAR | Status: DC | PRN

## 2012-07-08 MED ORDER — PROMETHAZINE HCL 25 MG/ML IJ SOLN
6.2500 mg | INTRAMUSCULAR | Status: DC | PRN
  Administered 2012-07-08: 6.25 mg via INTRAVENOUS

## 2012-07-08 MED ORDER — ONDANSETRON HCL 4 MG/2ML IJ SOLN
INTRAMUSCULAR | Status: DC | PRN
  Administered 2012-07-08 (×2): 4 mg via INTRAVENOUS

## 2012-07-08 MED ORDER — FENTANYL CITRATE 0.05 MG/ML IJ SOLN
INTRAMUSCULAR | Status: DC | PRN
  Administered 2012-07-08: 50 ug via INTRAVENOUS

## 2012-07-08 MED ORDER — STERILE WATER FOR IRRIGATION IR SOLN
Status: DC | PRN
  Administered 2012-07-08: 3000 mL

## 2012-07-08 MED ORDER — DEXAMETHASONE SODIUM PHOSPHATE 4 MG/ML IJ SOLN
INTRAMUSCULAR | Status: DC | PRN
  Administered 2012-07-08: 10 mg via INTRAVENOUS

## 2012-07-08 MED ORDER — MEPERIDINE HCL 25 MG/ML IJ SOLN: 6.2500 mg | INTRAMUSCULAR | Status: DC | PRN

## 2012-07-08 MED ORDER — LACTATED RINGERS IV SOLN
INTRAVENOUS | Status: DC
  Administered 2012-07-08: 07:00:00 via INTRAVENOUS

## 2012-07-08 MED ORDER — LIDOCAINE HCL (CARDIAC) 20 MG/ML IV SOLN
INTRAVENOUS | Status: DC | PRN
  Administered 2012-07-08: 50 mg via INTRAVENOUS

## 2012-07-08 MED ORDER — SUCCINYLCHOLINE CHLORIDE 20 MG/ML IJ SOLN
INTRAMUSCULAR | Status: DC | PRN
  Administered 2012-07-08: 70 mg via INTRAVENOUS

## 2012-07-08 MED ORDER — PHENAZOPYRIDINE HCL 200 MG PO TABS
ORAL | Status: DC | PRN
  Administered 2012-07-08: 09:00:00 via INTRAVESICAL

## 2012-07-08 MED ORDER — GLYCOPYRROLATE 0.2 MG/ML IJ SOLN
INTRAMUSCULAR | Status: DC | PRN
  Administered 2012-07-08: 0.2 mg via INTRAVENOUS

## 2012-07-08 MED ORDER — LACTATED RINGERS IV SOLN
INTRAVENOUS | Status: DC
  Administered 2012-07-08: 09:00:00 via INTRAVENOUS

## 2012-07-08 SURGICAL SUPPLY — 22 items
BAG DRAIN URO-CYSTO SKYTR STRL (DRAIN) ×2 IMPLANT
CANISTER SUCT LVC 12 LTR MEDI- (MISCELLANEOUS) ×2 IMPLANT
CATH FOLEY 2WAY SLVR  5CC 18FR (CATHETERS)
CATH FOLEY 2WAY SLVR 5CC 18FR (CATHETERS) IMPLANT
CATH ROBINSON RED A/P 12FR (CATHETERS) IMPLANT
CATH ROBINSON RED A/P 14FR (CATHETERS) ×2 IMPLANT
CLOTH BEACON ORANGE TIMEOUT ST (SAFETY) ×2 IMPLANT
DRAPE CAMERA CLOSED 9X96 (DRAPES) ×2 IMPLANT
ELECT REM PT RETURN 9FT ADLT (ELECTROSURGICAL)
ELECTRODE REM PT RTRN 9FT ADLT (ELECTROSURGICAL) IMPLANT
GLOVE BIO SURGEON STRL SZ7.5 (GLOVE) ×2 IMPLANT
GLOVE BIOGEL PI IND STRL 6.5 (GLOVE) ×1 IMPLANT
GLOVE BIOGEL PI IND STRL 7.0 (GLOVE) ×1 IMPLANT
GLOVE BIOGEL PI INDICATOR 6.5 (GLOVE) ×1
GLOVE BIOGEL PI INDICATOR 7.0 (GLOVE) ×1
GOWN STRL REIN XL XLG (GOWN DISPOSABLE) ×2 IMPLANT
NDL SAFETY ECLIPSE 18X1.5 (NEEDLE) ×1 IMPLANT
NEEDLE HYPO 18GX1.5 SHARP (NEEDLE) ×1
PACK CYSTOSCOPY (CUSTOM PROCEDURE TRAY) ×2 IMPLANT
SUT SILK 0 TIES 10X30 (SUTURE) IMPLANT
SYR 20CC LL (SYRINGE) ×2 IMPLANT
WATER STERILE IRR 3000ML UROMA (IV SOLUTION) ×2 IMPLANT

## 2012-07-08 NOTE — Op Note (Signed)
Preoperative diagnosis: Pelvic pain Postoperative diagnosis: Pelvic pain Surgery: Cystoscopy bladder hydrodistention and bladder installation therapy Surgeon: Dr. Lorin Picket Jayceon Troy  The patient consented above procedure was given antibiotics. She has multiple allergies  0.1 French cystoscope was utilized. Bladder mucosa and trigone were normal. There is no stitch foreign body or carcinoma. She was hydrodistended to 450 mL. The bladder was emptied. I reinspected the bladder and the bladder was completely normal. There is no bladder injury. The bladder was emptied  As a separate procedure instilled 15 cc of 0.5% Marcaine was 400 mg of peridium in the bladder.  The patient be followed as per protocol

## 2012-07-08 NOTE — Anesthesia Procedure Notes (Addendum)
Procedure Name: Intubation Date/Time: 07/08/2012 8:31 AM Performed by: Maris Berger T Pre-anesthesia Checklist: Patient identified, Emergency Drugs available, Suction available and Patient being monitored Patient Re-evaluated:Patient Re-evaluated prior to inductionOxygen Delivery Method: Circle System Utilized Preoxygenation: Pre-oxygenation with 100% oxygen Intubation Type: IV induction, Cricoid Pressure applied and Rapid sequence Laryngoscope Size: 3 and Mac Grade View: Grade I Tube type: Oral Number of attempts: 1 Airway Equipment and Method: stylet Placement Confirmation: ETT inserted through vocal cords under direct vision,  positive ETCO2 and breath sounds checked- equal and bilateral Secured at: 20 cm Tube secured with: Tape Dental Injury: Teeth and Oropharynx as per pre-operative assessment and Injury to lip  Comments: Small pinch to l upper lip, pressure held on it.  No dental damage     Performed by: Briant Sites

## 2012-07-08 NOTE — Anesthesia Preprocedure Evaluation (Addendum)
Anesthesia Evaluation  Patient identified by MRN, date of birth, ID band Patient awake    Reviewed: Allergy & Precautions, H&P , NPO status , Patient's Chart, lab work & pertinent test results  Airway Mallampati: II TM Distance: >3 FB Neck ROM: Full    Dental No notable dental hx.    Pulmonary neg pulmonary ROS,  breath sounds clear to auscultation  Pulmonary exam normal       Cardiovascular hypertension, Pt. on medications negative cardio ROS  Rhythm:Regular Rate:Normal     Neuro/Psych negative neurological ROS  negative psych ROS   GI/Hepatic negative GI ROS, Neg liver ROS, hiatal hernia (large hiatal hernia), GERD-  ,  Endo/Other  negative endocrine ROS  Renal/GU negative Renal ROS  negative genitourinary   Musculoskeletal negative musculoskeletal ROS (+)   Abdominal   Peds negative pediatric ROS (+)  Hematology negative hematology ROS (+)   Anesthesia Other Findings   Reproductive/Obstetrics negative OB ROS                           Anesthesia Physical Anesthesia Plan  ASA: II  Anesthesia Plan: General   Post-op Pain Management:    Induction: Intravenous, Rapid sequence and Cricoid pressure planned  Airway Management Planned:   Additional Equipment:   Intra-op Plan:   Post-operative Plan:   Informed Consent: I have reviewed the patients History and Physical, chart, labs and discussed the procedure including the risks, benefits and alternatives for the proposed anesthesia with the patient or authorized representative who has indicated his/her understanding and acceptance.   Dental advisory given  Plan Discussed with: CRNA  Anesthesia Plan Comments: (Pt would need RSI for GA due to a large hiatal hernia)       Anesthesia Quick Evaluation

## 2012-07-08 NOTE — Transfer of Care (Signed)
Immediate Anesthesia Transfer of Care Note  Patient: Tamara Aguilar  Procedure(s) Performed: Procedure(s) (LRB): CYSTOSCOPY/HYDRODISTENSION (N/A)  Patient Location: PACU  Anesthesia Type: General  Level of Consciousness: awake, alert  and oriented  Airway & Oxygen Therapy: Patient Spontanous Breathing and Patient connected to nasal cannula oxygen  Post-op Assessment: Report given to PACU RN  Post vital signs: Reviewed and stable  Complications: No apparent anesthesia complications

## 2012-07-08 NOTE — Interval H&P Note (Signed)
History and Physical Interval Note:  07/08/2012 7:04 AM  Tamara Aguilar  has presented today for surgery, with the diagnosis of pelvic pain  The various methods of treatment have been discussed with the patient and family. After consideration of risks, benefits and other options for treatment, the patient has consented to  Procedure(s) (LRB): CYSTOSCOPY/HYDRODISTENSION (N/A) as a surgical intervention .  The patient's history has been reviewed, patient examined, no change in status, stable for surgery.  I have reviewed the patient's chart and labs.  Questions were answered to the patient's satisfaction.     Jalise Zawistowski A

## 2012-07-09 ENCOUNTER — Encounter (HOSPITAL_BASED_OUTPATIENT_CLINIC_OR_DEPARTMENT_OTHER): Payer: Self-pay | Admitting: Urology

## 2012-07-09 NOTE — Anesthesia Postprocedure Evaluation (Signed)
  Anesthesia Post-op Note  Patient: Tamara Aguilar  Procedure(s) Performed: Procedure(s) (LRB): CYSTOSCOPY/HYDRODISTENSION (N/A)  Patient Location: PACU  Anesthesia Type: General  Level of Consciousness: awake and alert   Airway and Oxygen Therapy: Patient Spontanous Breathing  Post-op Pain: mild  Post-op Assessment: Post-op Vital signs reviewed, Patient's Cardiovascular Status Stable, Respiratory Function Stable, Patent Airway and No signs of Nausea or vomiting  Post-op Vital Signs: stable  Complications: No apparent anesthesia complications

## 2012-07-12 NOTE — Addendum Note (Signed)
Addendum  created 07/12/12 1015 by Briant Sites, CRNA   Modules edited:Anesthesia Medication Administration

## 2012-07-12 NOTE — Addendum Note (Signed)
Addendum  created 07/12/12 1014 by Briant Sites, CRNA   Modules edited:Anesthesia Medication Administration

## 2012-09-21 ENCOUNTER — Other Ambulatory Visit (HOSPITAL_BASED_OUTPATIENT_CLINIC_OR_DEPARTMENT_OTHER): Payer: Self-pay | Admitting: Family Medicine

## 2012-09-21 DIAGNOSIS — Z1231 Encounter for screening mammogram for malignant neoplasm of breast: Secondary | ICD-10-CM

## 2012-09-22 ENCOUNTER — Ambulatory Visit (HOSPITAL_BASED_OUTPATIENT_CLINIC_OR_DEPARTMENT_OTHER)
Admission: RE | Admit: 2012-09-22 | Discharge: 2012-09-22 | Disposition: A | Discharge status: Home / Self Care | Payer: BC Managed Care – PPO | Source: Ambulatory Visit | Attending: Family Medicine | Admitting: Family Medicine

## 2012-09-22 DIAGNOSIS — R922 Inconclusive mammogram: Secondary | ICD-10-CM | POA: Insufficient documentation

## 2012-09-22 DIAGNOSIS — Z1231 Encounter for screening mammogram for malignant neoplasm of breast: Secondary | ICD-10-CM

## 2012-09-24 ENCOUNTER — Other Ambulatory Visit: Payer: Self-pay | Admitting: Family Medicine

## 2012-09-24 DIAGNOSIS — R928 Other abnormal and inconclusive findings on diagnostic imaging of breast: Secondary | ICD-10-CM

## 2012-09-27 ENCOUNTER — Ambulatory Visit: Payer: BC Managed Care – PPO | Admitting: Internal Medicine

## 2012-09-27 ENCOUNTER — Ambulatory Visit (INDEPENDENT_AMBULATORY_CARE_PROVIDER_SITE_OTHER): Payer: BC Managed Care – PPO | Admitting: Internal Medicine

## 2012-09-27 ENCOUNTER — Encounter: Payer: Self-pay | Admitting: Internal Medicine

## 2012-09-27 VITALS — BP 138/86 | HR 97 | Temp 97.9°F | Resp 18 | Wt 134.8 lb

## 2012-09-27 DIAGNOSIS — Z85828 Personal history of other malignant neoplasm of skin: Secondary | ICD-10-CM

## 2012-09-27 DIAGNOSIS — Z90710 Acquired absence of both cervix and uterus: Secondary | ICD-10-CM

## 2012-09-27 DIAGNOSIS — I10 Essential (primary) hypertension: Secondary | ICD-10-CM

## 2012-09-27 DIAGNOSIS — K219 Gastro-esophageal reflux disease without esophagitis: Secondary | ICD-10-CM

## 2012-09-27 DIAGNOSIS — E785 Hyperlipidemia, unspecified: Secondary | ICD-10-CM

## 2012-09-27 DIAGNOSIS — M1990 Unspecified osteoarthritis, unspecified site: Secondary | ICD-10-CM

## 2012-09-27 DIAGNOSIS — N3010 Interstitial cystitis (chronic) without hematuria: Secondary | ICD-10-CM | POA: Insufficient documentation

## 2012-09-27 LAB — COMPREHENSIVE METABOLIC PANEL
ALT: 21 U/L (ref 0–35)
AST: 22 U/L (ref 0–37)
Calcium: 9.6 mg/dL (ref 8.4–10.5)
Chloride: 95 mEq/L — ABNORMAL LOW (ref 96–112)
Creat: 0.79 mg/dL (ref 0.50–1.10)
Potassium: 4 mEq/L (ref 3.5–5.3)

## 2012-09-27 NOTE — Progress Notes (Signed)
Subjective:    Patient ID: Tamara Aguilar, female    DOB: 10/10/1946, 66 y.o.   MRN: [id number]  HPI New pt here for first visit.   Former care Dr. Gerri Spore  Allergies  Allergen Reactions  . Azithromycin Nausea And Vomiting    Severe n/v  . Codeine Nausea And Vomiting  . Levofloxacin Nausea And Vomiting    Severe n/v  . Morphine Nausea And Vomiting    Severe n/v  . Red Dye Hives  . Synthroid (Levothyroxine Sodium) Other (See Comments)    "Caused tachycardia and the shakes"  . Amoxicillin Rash  . Benzonatate Palpitations    (tessalon) rapid heart beat   . Caffeine Palpitations  . Guaifenesin Palpitations    Rapid heart beat  . Imipramine Palpitations    Rapid heart beat  . Tetracycline Swelling and Rash   Past Medical History  Diagnosis Date  . Hypertension   . IBS (irritable bowel syndrome)   . Pelvic pain in female   . Frequency of urination   . Urgency of urination   . Nocturia   . Arthritis knees, hips, hands, feet   . DDD (degenerative disc disease), cervical   . Chronic joint pain due to arthritis  . GERD (gastroesophageal reflux disease)   . H/O hiatal hernia    Past Surgical History  Procedure Date  . Abdominal hysterectomy AGE 31    AND APPENDECTOMY (PELVIC PROLAPSE AND ENDOMETRIOSIS)  . Laparotomy bilateral salpingoophectomy and marshall-marchetti-krantz procedure AGE 48    URINARY INCONTINENCE  . Dilation and curettage of uterus X3  . Cholecystectomy 1992  . Tonsillectomy and adenoidectomy CHILD  . Basal cell carcinoma excision 2009    [facility]  . Cardiovascular stress test 03-11-2010  DR KATZ    NORMAL NUCLEAR STUDY/ EF 70%  . Transthoracic echocardiogram 03-01-2010    MILD LVH/ LVSF NORMAL/ EF 55-60%  . Cysto with hydrodistension 07/08/2012    Procedure: CYSTOSCOPY/HYDRODISTENSION;  Surgeon: Martina Sinner, MD;  Location: [facility];  Service: Urology;  Laterality: N/A;  cysto, hod and instillation m&p     History    Social History  . Marital Status: Married    Spouse Name: N/A    Number of Children: N/A  . Years of Education: N/A   Occupational History  . Not on file.   Social History Main Topics  . Smoking status: Never Smoker   . Smokeless tobacco: Never Used  . Alcohol Use: No  . Drug Use: No  . Sexually Active: Yes    Birth Control/ Protection: Post-menopausal   Other Topics Concern  . Not on file   Social History Narrative  . No narrative on file   Family History  Problem Relation Age of Onset  . Hypertension Mother   . Stroke Mother   . Heart disease Mother   . Cancer Father   . Hypertension Father   . Cancer Brother   . Cancer Paternal Aunt    Patient Active Problem List  Diagnosis  . HYPERTENSION  . CHEST PAIN   Current Outpatient Prescriptions on File Prior to Visit  Medication Sig Dispense Refill  . acetaminophen (TYLENOL ARTHRITIS PAIN) 650 MG CR tablet Take 650 mg by mouth 4 (four) times daily.      Marland Kitchen aspirin EC 81 MG tablet Take 81 mg by mouth daily. Pt will take x4 tablets every am if needed for pain  (pt to stop taking on 07-03-2012 for procedure on 07-08-2012)      .  Calcium Carbonate-Vitamin D (CALCIUM 600/VITAMIN D) 600-400 MG-UNIT per chew tablet Chew 2 tablets by mouth daily.      . diclofenac sodium (VOLTAREN) 1 % GEL Apply 1 application topically 2 (two) times daily.      . enalapril (VASOTEC) 20 MG tablet Take 20 mg by mouth every morning.      Marland Kitchen esomeprazole (NEXIUM) 40 MG capsule Take 40 mg by mouth daily before breakfast.      . estradiol (ESTRACE) 0.1 MG/GM vaginal cream Place 2 g vaginally 2 (two) times a week.      . hydrochlorothiazide (HYDRODIURIL) 25 MG tablet Take 25 mg by mouth every morning.      Marland Kitchen HYDROcodone-acetaminophen (VICODIN) 5-500 MG per tablet Take 1 tablet by mouth every 6 (six) hours as needed.      . Multiple Vitamins-Minerals (WOMENS MULTIVITAMIN PLUS PO) Take 1 tablet by mouth daily.      . traMADol (ULTRAM) 50 MG tablet Take  50 mg by mouth 2 (two) times daily. x1 pill 2 to 3 times daily      . zolpidem (AMBIEN) 10 MG tablet Take 10 mg by mouth at bedtime as needed. Takes 1/2 pill to 1 prn at hs      . Glucosamine-Chondroit-Vit C-Mn (GLUCOSAMINE CHONDR 1500 COMPLX PO) Take by mouth daily. Pt takes this in juice form called joint juice      . Omega-3 Fatty Acids (FISH OIL) 600 MG CAPS Take 1 capsule by mouth daily.           Review of Systems     Objective:   Physical Exam Physical Exam  Nursing note and vitals reviewed.  Constitutional: She is oriented to person, place, and time. She appears well-developed and well-nourished.  HENT:  Head: Normocephalic and atraumatic.  Cardiovascular: Normal rate and regular rhythm. Exam reveals no gallop and no friction rub.  No murmur heard.  Pulmonary/Chest: Breath sounds normal. She has no wheezes. She has no rales.  Neurological: She is alert and oriented to person, place, and time.  Skin: Skin is warm and dry.  Psychiatric: She has a normal mood and affect. Her behavior is normal.             Assessment & Plan:  Htn: adequate control continue current med  History of hyponatremia may be due to diuretic  Will recheck today  Djd:  She is on 3 differerent meds and still difficult to control.    Will refer to Dr. Cardell Peach in [facility]  Gerd on Nexium  Hyperlipidemia  Copy of DASH diet given  Interstitial cystitis managed by Dr. McDiarmid  History of basal cell cancer.   S/P Hysterectomy  endometriosis

## 2012-09-27 NOTE — Patient Instructions (Addendum)
Will set up visit to rheumatologist

## 2012-09-28 ENCOUNTER — Ambulatory Visit: Payer: BC Managed Care – PPO | Admitting: Internal Medicine

## 2012-09-28 LAB — CBC WITH DIFFERENTIAL/PLATELET
Basophils Absolute: 0 10*3/uL (ref 0.0–0.1)
Eosinophils Relative: 1 % (ref 0–5)
Lymphocytes Relative: 34 % (ref 12–46)
Neutro Abs: 4.3 10*3/uL (ref 1.7–7.7)
Platelets: 343 10*3/uL (ref 150–400)
RDW: 13.2 % (ref 11.5–15.5)
WBC: 7.7 10*3/uL (ref 4.0–10.5)

## 2012-09-30 ENCOUNTER — Other Ambulatory Visit: Payer: Self-pay | Admitting: Internal Medicine

## 2012-09-30 ENCOUNTER — Ambulatory Visit
Admission: RE | Admit: 2012-09-30 | Discharge: 2012-09-30 | Disposition: A | Discharge status: Home / Self Care | Payer: BC Managed Care – PPO | Source: Ambulatory Visit | Attending: Family Medicine | Admitting: Family Medicine

## 2012-09-30 DIAGNOSIS — R928 Other abnormal and inconclusive findings on diagnostic imaging of breast: Secondary | ICD-10-CM

## 2012-10-04 ENCOUNTER — Encounter: Payer: Self-pay | Admitting: *Deleted

## 2012-11-16 ENCOUNTER — Encounter: Payer: Self-pay | Admitting: Internal Medicine

## 2012-11-16 ENCOUNTER — Ambulatory Visit (INDEPENDENT_AMBULATORY_CARE_PROVIDER_SITE_OTHER): Payer: BC Managed Care – PPO | Admitting: Internal Medicine

## 2012-11-16 VITALS — BP 144/86 | HR 90 | Temp 97.2°F | Resp 18 | Wt 134.0 lb

## 2012-11-16 DIAGNOSIS — N952 Postmenopausal atrophic vaginitis: Secondary | ICD-10-CM

## 2012-11-16 DIAGNOSIS — E871 Hypo-osmolality and hyponatremia: Secondary | ICD-10-CM

## 2012-11-16 DIAGNOSIS — I10 Essential (primary) hypertension: Secondary | ICD-10-CM

## 2012-11-16 LAB — BASIC METABOLIC PANEL
BUN: 15 mg/dL (ref 6–23)
Chloride: 102 mEq/L (ref 96–112)
Creat: 0.8 mg/dL (ref 0.50–1.10)
Glucose, Bld: 89 mg/dL (ref 70–99)

## 2012-11-16 MED ORDER — AMLODIPINE BESY-BENAZEPRIL HCL 5-20 MG PO CAPS: 1.0000 | ORAL_CAPSULE | Freq: Every day | ORAL | Status: DC

## 2012-11-16 NOTE — Progress Notes (Signed)
Subjective:    Patient ID: Tamara Aguilar, female    DOB: [birth year], 66 y.o.   MRN: [id number]  HPI  Tamara Aguilar is here for follow up.  I stopped her HCTZ due to hyponatremia and she has been monitering her outpt BP.Marland Kitchen  Most SBP above 140.  Some are in the 150's  She is asymptomatic.  She take vasotec 20 mg  No problems with coughing.  Allergies  Allergen Reactions  . Azithromycin Nausea And Vomiting    Severe n/v  . Codeine Nausea And Vomiting  . Levofloxacin Nausea And Vomiting    Severe n/v  . Morphine Nausea And Vomiting    Severe n/v  . Red Dye Hives  . Synthroid (Levothyroxine Sodium) Other (See Comments)    "Caused tachycardia and the shakes"  . Amoxicillin Rash  . Benzonatate Palpitations    (tessalon) rapid heart beat   . Caffeine Palpitations  . Guaifenesin Palpitations    Rapid heart beat  . Imipramine Palpitations    Rapid heart beat  . Tetracycline Swelling and Rash   Past Medical History  Diagnosis Date  . Hypertension   . IBS (irritable bowel syndrome)   . Pelvic pain in female   . Frequency of urination   . Urgency of urination   . Nocturia   . Arthritis knees, hips, hands, feet   . DDD (degenerative disc disease), cervical   . Chronic joint pain due to arthritis  . GERD (gastroesophageal reflux disease)   . H/O hiatal hernia    Past Surgical History  Procedure Date  . Abdominal hysterectomy AGE 33    AND APPENDECTOMY (PELVIC PROLAPSE AND ENDOMETRIOSIS)  . Laparotomy bilateral salpingoophectomy and marshall-marchetti-krantz procedure AGE 41    URINARY INCONTINENCE  . Dilation and curettage of uterus X3  . Cholecystectomy 1992  . Tonsillectomy and adenoidectomy CHILD  . Basal cell carcinoma excision 2009    [facility]  . Cardiovascular stress test 03-11-2010  DR KATZ    NORMAL NUCLEAR STUDY/ EF 70%  . Transthoracic echocardiogram 03-01-2010    MILD LVH/ LVSF NORMAL/ EF 55-60%  . Cysto with hydrodistension 07/08/2012    Procedure:  CYSTOSCOPY/HYDRODISTENSION;  Surgeon: Martina Sinner, MD;  Location: [facility];  Service: Urology;  Laterality: N/A;  cysto, hod and instillation m&p     History   Social History  . Marital Status: Married    Spouse Name: N/A    Number of Children: N/A  . Years of Education: N/A   Occupational History  . Not on file.   Social History Main Topics  . Smoking status: Never Smoker   . Smokeless tobacco: Never Used  . Alcohol Use: No  . Drug Use: No  . Sexually Active: Yes    Birth Control/ Protection: Post-menopausal   Other Topics Concern  . Not on file   Social History Narrative  . No narrative on file   Family History  Problem Relation Age of Onset  . Hypertension Mother   . Stroke Mother   . Heart disease Mother   . Cancer Father   . Hypertension Father   . Cancer Brother   . Cancer Paternal Aunt    Patient Active Problem List  Diagnosis  . HYPERTENSION  . CHEST PAIN  . DJD (degenerative joint disease)  . GERD (gastroesophageal reflux disease)  . S/P hysterectomy  . Interstitial cystitis  . Hx of skin cancer, basal cell  . Other and unspecified hyperlipidemia  . Atrophic vaginitis  Current Outpatient Prescriptions on File Prior to Visit  Medication Sig Dispense Refill  . acetaminophen (TYLENOL ARTHRITIS PAIN) 650 MG CR tablet Take 650 mg by mouth 4 (four) times daily.      Marland Kitchen aspirin EC 81 MG tablet Take 81 mg by mouth daily. Pt will take x4 tablets every am if needed for pain  (pt to stop taking on 07-03-2012 for procedure on 07-08-2012)      . Calcium Carbonate-Vitamin D (CALCIUM 600/VITAMIN D) 600-400 MG-UNIT per chew tablet Chew 2 tablets by mouth daily.      . diclofenac sodium (VOLTAREN) 1 % GEL Apply 1 application topically 2 (two) times daily.      . enalapril (VASOTEC) 20 MG tablet Take 20 mg by mouth every morning.      Marland Kitchen esomeprazole (NEXIUM) 40 MG capsule Take 40 mg by mouth daily before breakfast.      . estradiol (ESTRACE)  0.1 MG/GM vaginal cream Place 2 g vaginally once a week.       Marland Kitchen HYDROcodone-acetaminophen (VICODIN) 5-500 MG per tablet Take 1 tablet by mouth every 6 (six) hours as needed.      . Multiple Vitamins-Minerals (WOMENS MULTIVITAMIN PLUS PO) Take 1 tablet by mouth daily.      . traMADol (ULTRAM) 50 MG tablet Take 50 mg by mouth 2 (two) times daily. x1 pill 2 to 3 times daily      . zolpidem (AMBIEN) 10 MG tablet Take 10 mg by mouth at bedtime as needed. Takes 1/2 pill to 1 prn at hs          Review of Systems    see HPI Objective:   Physical Exam   Physical Exam  Nursing note and vitals reviewed.  Constitutional: She is oriented to person, place, and time. She appears well-developed and well-nourished.  HENT:  Head: Normocephalic and atraumatic.  Cardiovascular: Normal rate and regular rhythm. Exam reveals no gallop and no friction rub.  No murmur heard.  Pulmonary/Chest: Breath sounds normal. She has no wheezes. She has no rales.  Neurological: She is alert and oriented to person, place, and time.  Skin: Skin is warm and dry.  Psychiatric: She has a normal mood and affect. Her behavior is normal.  Ext no edema        Assessment & Plan:  HTN  Will change med to Lotrel 5/20 mg daily.  OK for her to finish her current supply of vasotec then initiatie lotrel  Hyponatremia   Will recheck today  DJD  Continue current meds.    See me as needed

## 2012-11-16 NOTE — Patient Instructions (Addendum)
See me as needed  Labs will be mailed to you 

## 2012-11-25 ENCOUNTER — Encounter: Payer: Self-pay | Admitting: *Deleted

## 2012-12-16 ENCOUNTER — Other Ambulatory Visit: Payer: Self-pay | Admitting: Internal Medicine

## 2012-12-16 NOTE — Telephone Encounter (Signed)
Pt states a fax will be coming in for a refill of diclofenac sodium (VOLTAREN) 1 % GEL She needs 4 tubes at a time.. It is cheaper for her than getting one tube at a time.. The pharmacy should be contacting the office.. Pt wanted to just give a FYI.Marland Kitchen

## 2012-12-20 MED ORDER — DICLOFENAC SODIUM 1 % TD GEL: 1.0000 "application " | Freq: Two times a day (BID) | TRANSDERMAL | Status: DC

## 2012-12-20 NOTE — Telephone Encounter (Signed)
Needs refill x 4

## 2012-12-22 ENCOUNTER — Ambulatory Visit (INDEPENDENT_AMBULATORY_CARE_PROVIDER_SITE_OTHER): Payer: BC Managed Care – PPO | Admitting: Internal Medicine

## 2012-12-22 ENCOUNTER — Encounter: Payer: Self-pay | Admitting: Internal Medicine

## 2012-12-22 VITALS — BP 136/80 | HR 88 | Temp 98.0°F | Resp 18 | Ht 60.0 in | Wt 137.0 lb

## 2012-12-22 DIAGNOSIS — M1990 Unspecified osteoarthritis, unspecified site: Secondary | ICD-10-CM

## 2012-12-22 DIAGNOSIS — E871 Hypo-osmolality and hyponatremia: Secondary | ICD-10-CM

## 2012-12-22 DIAGNOSIS — G4700 Insomnia, unspecified: Secondary | ICD-10-CM

## 2012-12-22 DIAGNOSIS — I10 Essential (primary) hypertension: Secondary | ICD-10-CM

## 2012-12-22 MED ORDER — ESTRADIOL 0.1 MG/GM VA CREA: 2.0000 g | TOPICAL_CREAM | VAGINAL | Status: AC

## 2012-12-22 MED ORDER — FLUTICASONE PROPIONATE 50 MCG/ACT NA SUSP: 2.0000 | Freq: Every day | NASAL | Status: DC

## 2012-12-22 MED ORDER — AMLODIPINE BESY-BENAZEPRIL HCL 5-20 MG PO CAPS: 1.0000 | ORAL_CAPSULE | Freq: Every day | ORAL | Status: DC

## 2012-12-22 MED ORDER — ZOLPIDEM TARTRATE 5 MG PO TABS: 5.0000 mg | ORAL_TABLET | Freq: Every evening | ORAL | Status: DC | PRN

## 2012-12-22 NOTE — Progress Notes (Signed)
Subjective:    Patient ID: Tamara Aguilar, female    DOB: [birth year], 67 y.o.   MRN: [id number]  HPI Tamara Aguilar is here for follow up on BP after changing her to Lotrel 5/20.  Het hyponatremia has resolved.  NO LE edema.  She states  Her BP at home has been doing well.  SBP at home no higher than 136  DJD  She has seen Dr. Cardell Peach who will manage her pain meds  Insomnia  I counseled pt she must go to 5 mg of Ambien instead of 10 mg  Allergies  Allergen Reactions  . Azithromycin Nausea And Vomiting    Severe n/v  . Codeine Nausea And Vomiting  . Levofloxacin Nausea And Vomiting    Severe n/v  . Morphine Nausea And Vomiting    Severe n/v  . Red Dye Hives  . Synthroid (Levothyroxine Sodium) Other (See Comments)    "Caused tachycardia and the shakes"  . Amoxicillin Rash  . Benzonatate Palpitations    (tessalon) rapid heart beat   . Caffeine Palpitations  . Guaifenesin Palpitations    Rapid heart beat  . Imipramine Palpitations    Rapid heart beat  . Tetracycline Swelling and Rash   Past Medical History  Diagnosis Date  . Hypertension   . IBS (irritable bowel syndrome)   . Pelvic pain in female   . Frequency of urination   . Urgency of urination   . Nocturia   . Arthritis knees, hips, hands, feet   . DDD (degenerative disc disease), cervical   . Chronic joint pain due to arthritis  . GERD (gastroesophageal reflux disease)   . H/O hiatal hernia    Past Surgical History  Procedure Date  . Abdominal hysterectomy AGE 63    AND APPENDECTOMY (PELVIC PROLAPSE AND ENDOMETRIOSIS)  . Laparotomy bilateral salpingoophectomy and marshall-marchetti-krantz procedure AGE 82    URINARY INCONTINENCE  . Dilation and curettage of uterus X3  . Cholecystectomy 1992  . Tonsillectomy and adenoidectomy CHILD  . Basal cell carcinoma excision 2009    FOREHEAD  . Cardiovascular stress test 03-11-2010  DR KATZ    NORMAL NUCLEAR STUDY/ EF 70%  . Transthoracic echocardiogram 03-01-2010    MILD LVH/  LVSF NORMAL/ EF 55-60%  . Cysto with hydrodistension 07/08/2012    Procedure: CYSTOSCOPY/HYDRODISTENSION;  Surgeon: Martina Sinner, MD;  Location: [facility];  Service: Urology;  Laterality: N/A;  cysto, hod and instillation m&p     History   Social History  . Marital Status: Married    Spouse Name: N/A    Number of Children: N/A  . Years of Education: N/A   Occupational History  . Not on file.   Social History Main Topics  . Smoking status: Never Smoker   . Smokeless tobacco: Never Used  . Alcohol Use: No  . Drug Use: No  . Sexually Active: Yes    Birth Control/ Protection: Post-menopausal   Other Topics Concern  . Not on file   Social History Narrative  . No narrative on file   Family History  Problem Relation Age of Onset  . Hypertension Mother   . Stroke Mother   . Heart disease Mother   . Cancer Father   . Hypertension Father   . Cancer Brother   . Cancer Paternal Aunt    Patient Active Problem List  Diagnosis  . HYPERTENSION  . CHEST PAIN  . DJD (degenerative joint disease)  . GERD (gastroesophageal reflux disease)  .  S/P hysterectomy  . Interstitial cystitis  . Hx of skin cancer, basal cell  . Other and unspecified hyperlipidemia  . Atrophic vaginitis  . Hyponatremia   Current Outpatient Prescriptions on File Prior to Visit  Medication Sig Dispense Refill  . acetaminophen (TYLENOL ARTHRITIS PAIN) 650 MG CR tablet Take 650 mg by mouth 4 (four) times daily.      Marland Kitchen amLODipine-benazepril (LOTREL) 5-20 MG per capsule Take 1 capsule by mouth daily.  90 capsule  1  . aspirin EC 81 MG tablet Take 81 mg by mouth daily. Pt will take x4 tablets every am if needed for pain  (pt to stop taking on 07-03-2012 for procedure on 07-08-2012)      . Calcium Carbonate-Vitamin D (CALCIUM 600/VITAMIN D) 600-400 MG-UNIT per chew tablet Chew 2 tablets by mouth daily.      . diclofenac sodium (VOLTAREN) 1 % GEL Apply 1 application topically 2 (two) times  daily.  1 Tube  0  . esomeprazole (NEXIUM) 40 MG capsule Take 40 mg by mouth daily before breakfast.      . fluticasone (FLONASE) 50 MCG/ACT nasal spray Place 2 sprays into the nose daily.  16 g  1  . HYDROcodone-acetaminophen (VICODIN) 5-500 MG per tablet Take 1 tablet by mouth every 6 (six) hours as needed.      . Multiple Vitamins-Minerals (WOMENS MULTIVITAMIN PLUS PO) Take 1 tablet by mouth daily.      . promethazine (PHENERGAN) 25 MG tablet Take 25 mg by mouth every 6 (six) hours as needed. One at bedtime PRN      . traMADol (ULTRAM) 50 MG tablet Take 50 mg by mouth 2 (two) times daily. x1 pill 2 to 3 times daily           Review of Systems see HPI   Objective:   Physical Exam Physical Exam  Nursing note and vitals reviewed.  Constitutional: She is oriented to person, place, and time. She appears well-developed and well-nourished.  HENT:  Head: Normocephalic and atraumatic.  Cardiovascular: Normal rate and regular rhythm. Exam reveals no gallop and no friction rub.  No murmur heard.  Pulmonary/Chest: Breath sounds normal. She has no wheezes. She has no rales.  Neurological: She is alert and oriented to person, place, and time.  Skin: Skin is warm and dry.  Psychiatric: She has a normal mood and affect. Her behavior is normal.             Assessment & Plan:  HTN:  Well controlled  Continue LOtrel  DJD OK for Volataren and further pain meds per Dr. Cardell Peach  Insomnia  Will reorder and reduce Ambien to 5 mg as needed  See me for CPE

## 2012-12-22 NOTE — Patient Instructions (Addendum)
See me as needed or for CPe

## 2012-12-23 ENCOUNTER — Telehealth: Payer: Self-pay | Admitting: Internal Medicine

## 2012-12-23 NOTE — Telephone Encounter (Signed)
Per [name]:    Tamara Aguilar DOB [birth year] called seen 12/22/2012.  Her pharmacy is going to fax a refill request over this afternoon for Voltaren Gel 1%. Thanks [name]

## 2012-12-26 ENCOUNTER — Encounter: Payer: Self-pay | Admitting: Internal Medicine

## 2012-12-26 DIAGNOSIS — M8580 Other specified disorders of bone density and structure, unspecified site: Secondary | ICD-10-CM | POA: Insufficient documentation

## 2013-01-20 ENCOUNTER — Other Ambulatory Visit: Payer: Self-pay | Admitting: Internal Medicine

## 2013-01-20 NOTE — Telephone Encounter (Signed)
Pt would like a refill on Voltaren Gel.  Would like 4 tubes called into pharmacy.  Pharmacy [facility] at [address] [place] phone number [phone].  Also would like a call back to discuss vitamin supplements with nurse.  Pt phone number [phone].

## 2013-01-21 ENCOUNTER — Other Ambulatory Visit: Payer: Self-pay | Admitting: *Deleted

## 2013-01-21 MED ORDER — DICLOFENAC SODIUM 1 % TD GEL: 1.0000 "application " | Freq: Two times a day (BID) | TRANSDERMAL | Status: AC

## 2013-01-21 NOTE — Telephone Encounter (Signed)
See Ardenias note refill request

## 2013-01-25 ENCOUNTER — Telehealth: Payer: Self-pay | Admitting: Internal Medicine

## 2013-01-25 NOTE — Telephone Encounter (Signed)
Pt lvm at 1129 02.25 stating she needs her diclofenac sodium (VOLTAREN) 1 % GEL It is suppose to be 4 tubes of 100 ? The voicemail is still on the machine... Pt states she will call in the morning... She is with her son now because he had major surgery.Marland KitchenMarland KitchenMarland Kitchen

## 2013-01-25 NOTE — Telephone Encounter (Signed)
Pt requesting four tubes of voltarin gel 4 tubes

## 2013-01-25 NOTE — Telephone Encounter (Signed)
Tamara Aguilar  Call pt and let her know that she is using way too much voltaren gel and she is at risk of a stomach or small bowel bleed.  Dr. Cardell Peach her rheumatologist is managing her meds for her chronic pain.  She is to call his office

## 2013-01-26 NOTE — Telephone Encounter (Signed)
Notified pt that she will need to talk to Dr Cardell Peach about Voltaren gel. Advised pt of risk pt voiced understanding and will contact Dr Cardell Peach

## 2013-02-01 ENCOUNTER — Other Ambulatory Visit: Payer: Self-pay | Admitting: *Deleted

## 2013-02-01 MED ORDER — ZOLPIDEM TARTRATE 5 MG PO TABS: 5.0000 mg | ORAL_TABLET | Freq: Every evening | ORAL | Status: AC | PRN

## 2013-02-01 NOTE — Telephone Encounter (Signed)
Refill request

## 2013-02-01 NOTE — Telephone Encounter (Signed)
Tamara Aguilar  OK to call in as ordered

## 2013-02-03 ENCOUNTER — Other Ambulatory Visit: Payer: Self-pay | Admitting: Internal Medicine

## 2013-02-03 NOTE — Telephone Encounter (Signed)
Called into [facility] 

## 2013-02-08 ENCOUNTER — Encounter: Payer: Self-pay | Admitting: Internal Medicine

## 2013-02-08 ENCOUNTER — Ambulatory Visit (INDEPENDENT_AMBULATORY_CARE_PROVIDER_SITE_OTHER): Payer: BC Managed Care – PPO | Admitting: Internal Medicine

## 2013-02-08 VITALS — BP 124/76 | HR 113 | Temp 99.3°F | Resp 20 | Wt 136.0 lb

## 2013-02-08 DIAGNOSIS — R05 Cough: Secondary | ICD-10-CM

## 2013-02-08 MED ORDER — HYDROCOD POLST-CHLORPHEN POLST 10-8 MG/5ML PO LQCR: ORAL | Status: DC

## 2013-02-08 MED ORDER — SULFAMETHOXAZOLE-TRIMETHOPRIM 800-160 MG PO TABS: 1.0000 | ORAL_TABLET | Freq: Two times a day (BID) | ORAL | Status: DC

## 2013-02-08 NOTE — Patient Instructions (Addendum)
See me in one week

## 2013-02-08 NOTE — Progress Notes (Signed)
Subjective:    Patient ID: Tamara Aguilar, female    DOB: 06/11/1946, 67 y.o.   MRN: [id number]  HPI  Tamara Aguilar is here for acute visit.   Marland Kitchen  One week ago began with head cold and now lots of pain in L ear and cough productive clear mucous.   No chest pain no SOb.  Had fever to 101 3 days ago but fever gone now.    Allergies  Allergen Reactions  . Azithromycin Nausea And Vomiting    Severe n/v  . Codeine Nausea And Vomiting  . Levofloxacin Nausea And Vomiting    Severe n/v  . Morphine Nausea And Vomiting    Severe n/v  . Red Dye Hives  . Synthroid (Levothyroxine Sodium) Other (See Comments)    "Caused tachycardia and the shakes"  . Amoxicillin Rash  . Benzonatate Palpitations    (tessalon) rapid heart beat   . Caffeine Palpitations  . Guaifenesin Palpitations    Rapid heart beat  . Imipramine Palpitations    Rapid heart beat  . Tetracycline Swelling and Rash   Past Medical History  Diagnosis Date  . Hypertension   . IBS (irritable bowel syndrome)   . Pelvic pain in female   . Frequency of urination   . Urgency of urination   . Nocturia   . Arthritis knees, hips, hands, feet   . DDD (degenerative disc disease), cervical   . Chronic joint pain due to arthritis  . GERD (gastroesophageal reflux disease)   . H/O hiatal hernia    Past Surgical History  Procedure Laterality Date  . Abdominal hysterectomy  AGE 45    AND APPENDECTOMY (PELVIC PROLAPSE AND ENDOMETRIOSIS)  . Laparotomy bilateral salpingoophectomy and marshall-marchetti-krantz procedure  AGE 42    URINARY INCONTINENCE  . Dilation and curettage of uterus  X3  . Cholecystectomy  1992  . Tonsillectomy and adenoidectomy  CHILD  . Basal cell carcinoma excision  2009    FOREHEAD  . Cardiovascular stress test  03-11-2010  DR KATZ    NORMAL NUCLEAR STUDY/ EF 70%  . Transthoracic echocardiogram  03-01-2010    MILD LVH/ LVSF NORMAL/ EF 55-60%  . Cysto with hydrodistension  07/08/2012    Procedure:  CYSTOSCOPY/HYDRODISTENSION;  Surgeon: Martina Sinner, MD;  Location: [facility];  Service: Urology;  Laterality: N/A;  cysto, hod and instillation m&p     History   Social History  . Marital Status: Married    Spouse Name: N/A    Number of Children: N/A  . Years of Education: N/A   Occupational History  . Not on file.   Social History Main Topics  . Smoking status: Never Smoker   . Smokeless tobacco: Never Used  . Alcohol Use: No  . Drug Use: No  . Sexually Active: Yes    Birth Control/ Protection: Post-menopausal   Other Topics Concern  . Not on file   Social History Narrative  . No narrative on file   Family History  Problem Relation Age of Onset  . Hypertension Mother   . Stroke Mother   . Heart disease Mother   . Cancer Father   . Hypertension Father   . Cancer Brother   . Cancer Paternal Aunt    Patient Active Problem List  Diagnosis  . HYPERTENSION  . CHEST PAIN  . DJD (degenerative joint disease)  . GERD (gastroesophageal reflux disease)  . S/P hysterectomy  . Interstitial cystitis  . Hx of skin  cancer, basal cell  . Other and unspecified hyperlipidemia  . Atrophic vaginitis  . Hyponatremia  . Osteopenia   Current Outpatient Prescriptions on File Prior to Visit  Medication Sig Dispense Refill  . acetaminophen (TYLENOL ARTHRITIS PAIN) 650 MG CR tablet Take 650 mg by mouth 4 (four) times daily.      Marland Kitchen amLODipine-benazepril (LOTREL) 5-20 MG per capsule Take 1 capsule by mouth daily.  90 capsule  1  . aspirin EC 81 MG tablet Take 81 mg by mouth daily. Pt will take x4 tablets every am if needed for pain  (pt to stop taking on 07-03-2012 for procedure on 07-08-2012)      . Calcium Carbonate-Vitamin D (CALCIUM 600/VITAMIN D) 600-400 MG-UNIT per chew tablet Chew 2 tablets by mouth daily.      . diclofenac sodium (VOLTAREN) 1 % GEL Apply 1 application topically 2 (two) times daily.  1 Tube  0  . esomeprazole (NEXIUM) 40 MG capsule Take 40  mg by mouth daily before breakfast.      . estradiol (ESTRACE) 0.1 MG/GM vaginal cream Place 0.25 Applicatorfuls vaginally once a week.  42.5 g  0  . fluticasone (FLONASE) 50 MCG/ACT nasal spray Place 2 sprays into the nose daily.  16 g  1  . Glucosamine-Chondroitin (MOVE FREE PO) Take 1 tablet by mouth.      Marland Kitchen HYDROcodone-acetaminophen (VICODIN) 5-500 MG per tablet Take 1 tablet by mouth every 6 (six) hours as needed.      . Multiple Vitamins-Minerals (WOMENS MULTIVITAMIN PLUS PO) Take 1 tablet by mouth daily.      . promethazine (PHENERGAN) 25 MG tablet Take 25 mg by mouth every 6 (six) hours as needed. One at bedtime PRN      . traMADol (ULTRAM) 50 MG tablet Take 50 mg by mouth 2 (two) times daily. x1 pill 2 to 3 times daily      . zolpidem (AMBIEN) 5 MG tablet Take 1 tablet (5 mg total) by mouth at bedtime as needed for sleep.  30 tablet  2   No current facility-administered medications on file prior to visit.      Review of Systems    see HPI Objective:   Physical Exam Physical Exam  Nursing note and vitals reviewed.  Constitutional: She is oriented to person, place, and time. She appears well-developed and well-nourished. She is cooperative.  HENT:  Head: Normocephalic and atraumatic.  Nose: Mucosal edema present.  Tm"s  Bilateral serous effusions Eyes: Conjunctivae and EOM are normal. Pupils are equal, round, and reactive to light.  Neck: Neck supple.  Cardiovascular: Regular rhythm, normal heart sounds, intact distal pulses and normal pulses. Exam reveals no gallop and no friction rub.  No murmur heard.  Pulmonary/Chest: She has no wheezes. She has rhonchi. She has no rales.  Neurological: She is alert and oriented to person, place, and time.  Skin: Skin is warm and dry. No abrasion, no bruising, no ecchymosis and no rash noted. No cyanosis. Nails show no clubbing.  Psychiatric: She has a normal mood and affect. Her speech is normal and behavior is normal.             Assessment & Plan:  Bronchitis  Will give Bactrim DS  i bid for 7 days.  See me in one week.    Cough Pt tells me she can tolerate Tussionex  1 tsp q12h  See me one week

## 2013-02-15 ENCOUNTER — Ambulatory Visit: Payer: BC Managed Care – PPO | Admitting: Internal Medicine

## 2013-02-22 ENCOUNTER — Ambulatory Visit: Payer: BC Managed Care – PPO | Admitting: Internal Medicine

## 2013-02-28 ENCOUNTER — Other Ambulatory Visit: Payer: Self-pay | Admitting: Internal Medicine

## 2013-03-01 ENCOUNTER — Other Ambulatory Visit: Payer: Self-pay | Admitting: *Deleted

## 2013-03-01 NOTE — Telephone Encounter (Signed)
Refill request

## 2013-03-02 MED ORDER — ESOMEPRAZOLE MAGNESIUM 40 MG PO CPDR: 40.0000 mg | DELAYED_RELEASE_CAPSULE | Freq: Every day | ORAL | Status: AC

## 2013-06-10 ENCOUNTER — Telehealth: Payer: Self-pay | Admitting: *Deleted

## 2013-06-10 NOTE — Telephone Encounter (Signed)
Pt calls and states that she has to go out of town her parents are not doing well and she needs to cancel her appt for 7/14

## 2013-06-13 ENCOUNTER — Encounter: Payer: BC Managed Care – PPO | Admitting: Internal Medicine

## 2013-07-04 ENCOUNTER — Encounter: Payer: BC Managed Care – PPO | Admitting: Internal Medicine

## 2013-09-07 ENCOUNTER — Other Ambulatory Visit: Payer: Self-pay

## 2013-09-07 DIAGNOSIS — Z1231 Encounter for screening mammogram for malignant neoplasm of breast: Secondary | ICD-10-CM

## 2013-10-11 ENCOUNTER — Ambulatory Visit
Admission: RE | Admit: 2013-10-11 | Discharge: 2013-10-11 | Disposition: A | Discharge status: Home / Self Care | Payer: Medicare PPO | Source: Ambulatory Visit

## 2013-10-11 DIAGNOSIS — Z1231 Encounter for screening mammogram for malignant neoplasm of breast: Secondary | ICD-10-CM

## 2014-10-02 ENCOUNTER — Encounter: Payer: Self-pay | Admitting: Internal Medicine

## 2015-02-28 ENCOUNTER — Other Ambulatory Visit: Payer: Self-pay

## 2015-02-28 DIAGNOSIS — Z1231 Encounter for screening mammogram for malignant neoplasm of breast: Secondary | ICD-10-CM

## 2015-03-13 ENCOUNTER — Ambulatory Visit
Admission: RE | Admit: 2015-03-13 | Discharge: 2015-03-13 | Disposition: A | Discharge status: Home / Self Care | Payer: Medicare PPO | Source: Ambulatory Visit

## 2015-03-13 DIAGNOSIS — Z1231 Encounter for screening mammogram for malignant neoplasm of breast: Secondary | ICD-10-CM

## 2015-08-20 DIAGNOSIS — I10 Essential (primary) hypertension: Secondary | ICD-10-CM | POA: Diagnosis not present

## 2015-08-20 DIAGNOSIS — Z0000 Encounter for general adult medical examination without abnormal findings: Secondary | ICD-10-CM | POA: Diagnosis not present

## 2015-08-20 DIAGNOSIS — R5383 Other fatigue: Secondary | ICD-10-CM | POA: Diagnosis not present

## 2015-08-20 DIAGNOSIS — E559 Vitamin D deficiency, unspecified: Secondary | ICD-10-CM | POA: Diagnosis not present

## 2015-08-20 DIAGNOSIS — D539 Nutritional anemia, unspecified: Secondary | ICD-10-CM | POA: Diagnosis not present

## 2015-08-20 DIAGNOSIS — F330 Major depressive disorder, recurrent, mild: Secondary | ICD-10-CM | POA: Diagnosis not present

## 2015-08-20 DIAGNOSIS — E785 Hyperlipidemia, unspecified: Secondary | ICD-10-CM | POA: Diagnosis not present

## 2015-08-20 DIAGNOSIS — D649 Anemia, unspecified: Secondary | ICD-10-CM | POA: Diagnosis not present

## 2015-08-20 DIAGNOSIS — Z23 Encounter for immunization: Secondary | ICD-10-CM | POA: Diagnosis not present

## 2016-08-22 ENCOUNTER — Other Ambulatory Visit: Payer: Self-pay | Admitting: Family Medicine

## 2016-08-22 DIAGNOSIS — Z1231 Encounter for screening mammogram for malignant neoplasm of breast: Secondary | ICD-10-CM

## 2016-09-03 ENCOUNTER — Other Ambulatory Visit: Payer: Self-pay | Admitting: Family Medicine

## 2016-09-03 DIAGNOSIS — N644 Mastodynia: Secondary | ICD-10-CM

## 2016-09-08 ENCOUNTER — Ambulatory Visit
Admission: RE | Admit: 2016-09-08 | Discharge: 2016-09-08 | Disposition: A | Discharge status: Home / Self Care | Payer: Medicare Other | Source: Ambulatory Visit | Attending: Family Medicine | Admitting: Family Medicine

## 2016-09-08 DIAGNOSIS — N644 Mastodynia: Secondary | ICD-10-CM

## 2017-12-06 ENCOUNTER — Other Ambulatory Visit: Payer: Self-pay

## 2017-12-06 ENCOUNTER — Emergency Department (HOSPITAL_BASED_OUTPATIENT_CLINIC_OR_DEPARTMENT_OTHER)
Admission: EM | Admit: 2017-12-06 | Discharge: 2017-12-06 | Disposition: A | Discharge status: Home / Self Care | Payer: Medicare Other | Attending: Emergency Medicine | Admitting: Emergency Medicine

## 2017-12-06 ENCOUNTER — Emergency Department (HOSPITAL_BASED_OUTPATIENT_CLINIC_OR_DEPARTMENT_OTHER): Payer: Medicare Other

## 2017-12-06 ENCOUNTER — Encounter (HOSPITAL_BASED_OUTPATIENT_CLINIC_OR_DEPARTMENT_OTHER): Payer: Self-pay | Admitting: Emergency Medicine

## 2017-12-06 DIAGNOSIS — I10 Essential (primary) hypertension: Secondary | ICD-10-CM | POA: Insufficient documentation

## 2017-12-06 DIAGNOSIS — Z85828 Personal history of other malignant neoplasm of skin: Secondary | ICD-10-CM | POA: Insufficient documentation

## 2017-12-06 DIAGNOSIS — S01111A Laceration without foreign body of right eyelid and periocular area, initial encounter: Secondary | ICD-10-CM | POA: Insufficient documentation

## 2017-12-06 DIAGNOSIS — Z7982 Long term (current) use of aspirin: Secondary | ICD-10-CM | POA: Insufficient documentation

## 2017-12-06 DIAGNOSIS — Y929 Unspecified place or not applicable: Secondary | ICD-10-CM | POA: Insufficient documentation

## 2017-12-06 DIAGNOSIS — Y999 Unspecified external cause status: Secondary | ICD-10-CM | POA: Insufficient documentation

## 2017-12-06 DIAGNOSIS — Z79899 Other long term (current) drug therapy: Secondary | ICD-10-CM | POA: Diagnosis not present

## 2017-12-06 DIAGNOSIS — W109XXA Fall (on) (from) unspecified stairs and steps, initial encounter: Secondary | ICD-10-CM | POA: Diagnosis not present

## 2017-12-06 DIAGNOSIS — W19XXXA Unspecified fall, initial encounter: Secondary | ICD-10-CM

## 2017-12-06 DIAGNOSIS — Y9301 Activity, walking, marching and hiking: Secondary | ICD-10-CM | POA: Diagnosis not present

## 2017-12-06 DIAGNOSIS — S0990XA Unspecified injury of head, initial encounter: Secondary | ICD-10-CM | POA: Diagnosis present

## 2017-12-06 DIAGNOSIS — S0083XA Contusion of other part of head, initial encounter: Secondary | ICD-10-CM

## 2017-12-06 NOTE — ED Triage Notes (Signed)
Trip and fall, hit head on cement, denies LOC. Takes ASA but no other blood thinners. Large hematoma above R eye.

## 2017-12-06 NOTE — Discharge Instructions (Signed)
Use ice at least 3-4 times daily alternating 15 minutes on, 15 minutes off.  Do not get your wound wet for the first 24 hours.  Following the first 24 hours, you can get wet, but do not submerge.  The glue will fall off on its own.  Please follow-up with your doctor in 3-4 days for recheck.  Please return to the emergency department if you develop any new or worsening symptoms.

## 2017-12-06 NOTE — ED Provider Notes (Addendum)
[facility] EMERGENCY DEPARTMENT Provider Note   CSN: [id number] Arrival date & time: 12/06/17  1513     History   Chief Complaint Chief Complaint  Patient presents with  . Fall    HPI Tamara Aguilar is a 72 y.o. female with history of arthritis, hypertension who takes a baby aspirin daily who presents following fall.  Patient reports she tripped and fell on a step and hit her face on the concrete.  She did not lose consciousness.  She denies any nausea, vomiting, dizziness.  She has associated swelling over her right eye, but denies vision changes.  She also has a small laceration over her right eye where she believes her glasses hit her face.  She washed the wound with peroxide and applied antibiotic ointment immediately following.  She denies any other injuries including neck or back pain. Tetanus status unknown.  HPI  Past Medical History:  Diagnosis Date  . Arthritis knees, hips, hands, feet   . Chronic joint pain due to arthritis  . DDD (degenerative disc disease), cervical   . Frequency of urination   . GERD (gastroesophageal reflux disease)   . H/O hiatal hernia   . Hypertension   . IBS (irritable bowel syndrome)   . Nocturia   . Pelvic pain in female   . Urgency of urination     Patient Active Problem List   Diagnosis Date Noted  . Osteopenia 12/26/2012  . Atrophic vaginitis 11/16/2012  . Hyponatremia 11/16/2012  . DJD (degenerative joint disease) 09/27/2012  . GERD (gastroesophageal reflux disease) 09/27/2012  . S/P hysterectomy 09/27/2012  . Interstitial cystitis 09/27/2012  . Hx of skin cancer, basal cell 09/27/2012  . Other and unspecified hyperlipidemia 09/27/2012  . HYPERTENSION 03/18/2010  . CHEST PAIN 03/18/2010    Past Surgical History:  Procedure Laterality Date  . ABDOMINAL HYSTERECTOMY  AGE 74   AND APPENDECTOMY (PELVIC PROLAPSE AND ENDOMETRIOSIS)  . BASAL CELL CARCINOMA EXCISION  2009   FOREHEAD  . CARDIOVASCULAR STRESS TEST   03-11-2010  DR KATZ   NORMAL NUCLEAR STUDY/ EF 70%  . CHOLECYSTECTOMY  1992  . CYSTO WITH HYDRODISTENSION  07/08/2012   Procedure: CYSTOSCOPY/HYDRODISTENSION;  Surgeon: Martina SinnerScott A MacDiarmid, MD;  [facility];  Service: Urology;  Laterality: N/A;  cysto, hod and instillation m&p    . DILATION AND CURETTAGE OF UTERUS  X3  . LAPAROTOMY BILATERAL SALPINGOOPHECTOMY AND MARSHALL-MARCHETTI-KRANTZ PROCEDURE  AGE 83   URINARY INCONTINENCE  . TONSILLECTOMY AND ADENOIDECTOMY  CHILD  . TRANSTHORACIC ECHOCARDIOGRAM  03-01-2010   MILD LVH/ LVSF NORMAL/ EF 55-60%    OB History    Gravida Para Term Preterm AB Living   2         2   SAB TAB Ectopic Multiple Live Births                   Home Medications    Prior to Admission medications   Medication Sig Start Date End Date Taking? Authorizing Provider  enalapril (VASOTEC) 10 MG tablet Take 10 mg by mouth daily.   Yes [provider]  hydrochlorothiazide (HYDRODIURIL) 25 MG tablet Take 25 mg by mouth daily.   Yes [provider]  acetaminophen (TYLENOL ARTHRITIS PAIN) 650 MG CR tablet Take 650 mg by mouth 4 (four) times daily.    [provider]  amLODipine-benazepril (LOTREL) 5-20 MG per capsule Take 1 capsule by mouth daily. 12/22/12   Schoenhoff, Harrington Challengereborah D, MD  aspirin EC 81 MG tablet Take 81 mg by mouth daily. Pt will take x4 tablets every am if needed for pain  (pt to stop taking on 07-03-2012 for procedure on 07-08-2012)    [provider]  Calcium Carbonate-Vitamin D (CALCIUM 600/VITAMIN D) 600-400 MG-UNIT per chew tablet Chew 2 tablets by mouth daily.    [provider]  chlorpheniramine-HYDROcodone Stevphen Meuse PENNKINETIC ER) 10-8 MG/5ML LQCR Take one teaspoon every 12 hours as needed 02/08/13   Schoenhoff, Harrington Challenger, MD  diclofenac sodium (VOLTAREN) 1 % GEL Apply 1 application topically 2 (two) times daily. 01/21/13   Schoenhoff, Harrington Challenger, MD  esomeprazole (NEXIUM) 40 MG  capsule Take 1 capsule (40 mg total) by mouth daily before breakfast. 03/01/13   Schoenhoff, Harrington Challenger, MD  estradiol (ESTRACE) 0.1 MG/GM vaginal cream Place 0.25 Applicatorfuls vaginally once a week. 12/22/12   Schoenhoff, Harrington Challenger, MD  fluticasone (FLONASE) 50 MCG/ACT nasal spray Place 2 sprays into the nose daily. 12/22/12   Schoenhoff, Harrington Challenger, MD  Glucosamine-Chondroitin (MOVE FREE PO) Take 1 tablet by mouth.    [provider]  HYDROcodone-acetaminophen (VICODIN) 5-500 MG per tablet Take 1 tablet by mouth every 6 (six) hours as needed.    [provider]  Multiple Vitamins-Minerals (WOMENS MULTIVITAMIN PLUS PO) Take 1 tablet by mouth daily.    [provider]  promethazine (PHENERGAN) 25 MG tablet Take 25 mg by mouth every 6 (six) hours as needed. One at bedtime PRN    [provider]  sulfamethoxazole-trimethoprim (BACTRIM DS,SEPTRA DS) 800-160 MG per tablet Take 1 tablet by mouth 2 (two) times daily. 02/08/13   Schoenhoff, Harrington Challenger, MD  traMADol (ULTRAM) 50 MG tablet Take 50 mg by mouth 2 (two) times daily. x1 pill 2 to 3 times daily    [provider]  zolpidem (AMBIEN) 5 MG tablet Take 1 tablet (5 mg total) by mouth at bedtime as needed for sleep. 02/01/13   Schoenhoff, Harrington Challenger, MD    Family History Family History  Problem Relation Age of Onset  . Hypertension Mother   . Stroke Mother   . Heart disease Mother   . Cancer Father   . Hypertension Father   . Cancer Brother   . Cancer Paternal Aunt     Social History Social History   Tobacco Use  . Smoking status: Never Smoker  . Smokeless tobacco: Never Used  Substance Use Topics  . Alcohol use: No  . Drug use: No     Allergies   Azithromycin; Codeine; Levofloxacin; Morphine; Red dye; Synthroid [levothyroxine sodium]; Amoxicillin; Benzonatate; Caffeine; Guaifenesin; Imipramine; and Tetracycline   Review of Systems Review of Systems  Constitutional: Negative for chills and  fever.  HENT: Positive for facial swelling. Negative for sore throat.   Respiratory: Negative for shortness of breath.   Cardiovascular: Negative for chest pain.  Gastrointestinal: Negative for abdominal pain, nausea and vomiting.  Genitourinary: Negative for dysuria.  Musculoskeletal: Negative for back pain and neck pain.  Skin: Negative for rash and wound.  Neurological: Negative for syncope and headaches.  Psychiatric/Behavioral: The patient is not nervous/anxious.      Physical Exam Updated Vital Signs BP (!) 154/112 (BP Location: Right Arm)   Pulse 100   Temp 98.2 F (36.8 C) (Oral)   Resp 18   Ht 5' (1.524 m)   Wt 62.6 kg (138 lb)   SpO2 100%   BMI 26.95 kg/m   Physical Exam  Constitutional: She appears well-developed  and well-nourished. No distress.  HENT:  Head: Normocephalic and atraumatic.    Mouth/Throat: Oropharynx is clear and moist. No oropharyngeal exudate.  Eyes: Conjunctivae and EOM are normal. Pupils are equal, round, and reactive to light. Right eye exhibits no discharge. Left eye exhibits no discharge. No scleral icterus.  No pain or difficulty with EOMs  Neck: Normal range of motion. Neck supple. No thyromegaly present.  Cardiovascular: Normal rate, regular rhythm, normal heart sounds and intact distal pulses. Exam reveals no gallop and no friction rub.  No murmur heard. Pulmonary/Chest: Effort normal and breath sounds normal. No stridor. No respiratory distress. She has no wheezes. She has no rales.  Abdominal: Soft. Bowel sounds are normal. She exhibits no distension. There is no tenderness. There is no rebound and no guarding.  Musculoskeletal: She exhibits no edema.  Lymphadenopathy:    She has no cervical adenopathy.  Neurological: She is alert. Coordination normal.  CN 3-12 intact; normal sensation throughout; 5/5 strength in all 4 extremities; equal bilateral grip strength  Skin: Skin is warm and dry. No rash noted. She is not diaphoretic. No  pallor.  Psychiatric: She has a normal mood and affect.  Nursing note and vitals reviewed.    ED Treatments / Results  Labs (all labs ordered are listed, but only abnormal results are displayed) Labs Reviewed - No data to display  EKG  EKG Interpretation None       Radiology Ct Head Wo Contrast  Result Date: 12/06/2017 CLINICAL DATA:  Tripped over a curb and fell forward landing on face. EXAM: CT HEAD WITHOUT CONTRAST CT MAXILLOFACIAL WITHOUT CONTRAST TECHNIQUE: Multidetector CT imaging of the head and maxillofacial structures were performed using the standard protocol without intravenous contrast. Multiplanar CT image reconstructions of the maxillofacial structures were also generated. COMPARISON:  Head CT 03/01/2010 FINDINGS: CT HEAD FINDINGS Brain: Since There is no evidence for acute hemorrhage, hydrocephalus, mass lesion, or abnormal extra-axial fluid collection. No definite CT evidence for acute infarction. Vascular: No hyperdense vessel or unexpected calcification. Skull: No evidence for fracture. No worrisome lytic or sclerotic lesion. Other: None. CT MAXILLOFACIAL FINDINGS Osseous: No fracture or mandibular dislocation. No destructive process. Orbits: Negative. No traumatic or inflammatory finding. Sinuses: Clear. Soft tissues: Prominent contusion/hematoma noted right frontal scalp region. IMPRESSION: 1. No acute intracranial abnormality. 2. Prominent contusion/hematoma right frontal scalp without underlying acute bony abnormality. Electronically Signed   By: Kennith Center M.D.   On: 12/06/2017 16:24   Ct Maxillofacial Wo Contrast  Result Date: 12/06/2017 CLINICAL DATA:  Tripped over a curb and fell forward landing on face. EXAM: CT HEAD WITHOUT CONTRAST CT MAXILLOFACIAL WITHOUT CONTRAST TECHNIQUE: Multidetector CT imaging of the head and maxillofacial structures were performed using the standard protocol without intravenous contrast. Multiplanar CT image reconstructions of the  maxillofacial structures were also generated. COMPARISON:  Head CT 03/01/2010 FINDINGS: CT HEAD FINDINGS Brain: Since There is no evidence for acute hemorrhage, hydrocephalus, mass lesion, or abnormal extra-axial fluid collection. No definite CT evidence for acute infarction. Vascular: No hyperdense vessel or unexpected calcification. Skull: No evidence for fracture. No worrisome lytic or sclerotic lesion. Other: None. CT MAXILLOFACIAL FINDINGS Osseous: No fracture or mandibular dislocation. No destructive process. Orbits: Negative. No traumatic or inflammatory finding. Sinuses: Clear. Soft tissues: Prominent contusion/hematoma noted right frontal scalp region. IMPRESSION: 1. No acute intracranial abnormality. 2. Prominent contusion/hematoma right frontal scalp without underlying acute bony abnormality. Electronically Signed   By: Kennith Center M.D.   On: 12/06/2017 16:24  Procedures .Marland KitchenLaceration Repair Date/Time: 12/06/2017 5:40 PM Performed by: Emi Holes, PA-C Authorized by: Emi Holes, PA-C   Consent:    Consent obtained:  Verbal   Consent given by:  Patient   Risks discussed:  Pain and infection Anesthesia (see MAR for exact dosages):    Anesthesia method:  None Laceration details:    Location:  Face   Face location:  R upper eyelid   Extent:  Superficial   Length (cm):  1 Repair type:    Repair type:  Simple Exploration:    Wound exploration: wound explored through full range of motion     Wound extent: no foreign bodies/material noted   Treatment:    Area cleansed with:  Shur-Clens   Amount of cleaning:  Standard Skin repair:    Repair method:  Tissue adhesive Approximation:    Approximation:  Close   Vermilion border: well-aligned   Post-procedure details:    Dressing:  Open (no dressing)   Patient tolerance of procedure:  Tolerated well, no immediate complications   (including critical care time)  Medications Ordered in ED Medications - No data to  display   Initial Impression / Assessment and Plan / ED Course  I have reviewed the triage vital signs and the nursing notes.  Pertinent labs & imaging results that were available during my care of the patient were reviewed by me and considered in my medical decision making (see chart for details).     Patient with contusion and laceration over her eye.  Wound repaired with Dermabond. Tetanus status unknown, however patient would like to speak with her doctor tomorrow about this. She will make arrangements to obtain Tdap if she learns she is not up to date. CT head and maxillofacial showed no acute intracranial abnormality, but prominent contusion/hematoma over the right frontal scalp without underlying acute bony abnormality.  Normal neuro exam without focal deficits. Supportive treatment discussed including ice and Tylenol for headache, as needed.  Return precautions discussed.  Patient understands and agrees with plan.  Patient vitals stable and discharged in satisfactory condition.  Final Clinical Impressions(s) / ED Diagnoses   Final diagnoses:  Fall, initial encounter  Contusion of face, initial encounter    ED Discharge Orders    None       Verdis Prime 12/06/17 1741    Tilden Fossa, MD 12/07/17 0036    Emi Holes, PA-C 12/07/17 1610    Tilden Fossa, MD 12/08/17 1810

## 2018-12-08 ENCOUNTER — Other Ambulatory Visit: Payer: Self-pay | Admitting: Family Medicine

## 2018-12-08 DIAGNOSIS — Z1231 Encounter for screening mammogram for malignant neoplasm of breast: Secondary | ICD-10-CM

## 2019-01-07 ENCOUNTER — Ambulatory Visit
Admission: RE | Admit: 2019-01-07 | Discharge: 2019-01-07 | Disposition: A | Discharge status: Home / Self Care | Payer: Medicare Other | Source: Ambulatory Visit | Attending: Family Medicine | Admitting: Family Medicine

## 2019-01-07 DIAGNOSIS — Z1231 Encounter for screening mammogram for malignant neoplasm of breast: Secondary | ICD-10-CM

## 2019-01-10 ENCOUNTER — Other Ambulatory Visit: Payer: Self-pay | Admitting: Family Medicine

## 2019-01-10 DIAGNOSIS — R928 Other abnormal and inconclusive findings on diagnostic imaging of breast: Secondary | ICD-10-CM

## 2019-01-12 ENCOUNTER — Ambulatory Visit
Admission: RE | Admit: 2019-01-12 | Discharge: 2019-01-12 | Disposition: A | Discharge status: Home / Self Care | Payer: Medicare Other | Source: Ambulatory Visit | Attending: Family Medicine | Admitting: Family Medicine

## 2019-01-12 DIAGNOSIS — R928 Other abnormal and inconclusive findings on diagnostic imaging of breast: Secondary | ICD-10-CM

## 2019-01-20 ENCOUNTER — Ambulatory Visit: Payer: Self-pay | Admitting: Obstetrics & Gynecology

## 2019-02-08 ENCOUNTER — Ambulatory Visit: Payer: Medicare Other | Admitting: Obstetrics & Gynecology

## 2019-02-08 ENCOUNTER — Encounter: Payer: Self-pay | Admitting: Obstetrics & Gynecology

## 2019-02-08 VITALS — BP 124/76 | Ht 59.0 in | Wt 140.0 lb

## 2019-02-08 DIAGNOSIS — N816 Rectocele: Secondary | ICD-10-CM | POA: Diagnosis not present

## 2019-02-08 NOTE — Progress Notes (Signed)
    Tamara Aguilar 1946/04/17 [id number]        73 y.o.  G2P2L2  Mother of my patient Tamara Aguilar.  RP: Vaginal bulging for 2 weeks  HPI: Status post TAH with Marshall-Marchetti many years ago and status post BSO for endometriosis.  Menopause on no hormone replacement therapy.  Patient has noticed a vaginal bulging that is slightly uncomfortable especially after pushing for a hard stool.  Controlling mild stool incontinence by staying on the hard stool side.  In the last few days patient has worked on making her stools slightly softer and avoiding pushing which has decreased the vaginal bulging problem.  No urinary tract infection symptoms.  No stress urinary incontinence.  Abstinent.   OB History  Gravida Para Term Preterm AB Living  2 2       2   SAB TAB Ectopic Multiple Live Births               # Outcome Date GA Lbr Len/2nd Weight Sex Delivery Anes PTL Lv  2 Para           1 Para             Past medical history,surgical history, problem list, medications, allergies, family history and social history were all reviewed and documented in the EPIC chart.   Directed ROS with pertinent positives and negatives documented in the history of present illness/assessment and plan.  Exam:  Vitals:   02/08/19 1205  BP: 124/76  Weight: 140 lb (63.5 kg)  Height: 4\' 11"  (1.499 m)   General appearance:  Normal  Abdomen: Normal  Gynecologic exam: Vulva normal.  S/P Total hysterectomy.  Bimanual exam:  No pelvic mass felt. Rectocele grade 1/3 with Valsalva in standing position.  No Colpocele or Cystocele.   Assessment/Plan:  73 y.o. G2P2   1. Baden-Walker grade 1 rectocele Mildly symptomatic grade 1 out of 3 rectocele.  Precautions to avoid further progression of the rectocele discussed with patient.  Will work on stools to keep them slightly softer while not having incontinence and will avoid excessive pushing.  Decision to observe at this point.  If becomes more symptomatic, will  follow-up to reevaluate management.  Surgical correction of rectocele and pessary management reviewed thoroughly with patient.  Counseling on above issues and coordination of care more than 50% for 30 minutes.   Genia Del MD, 12:17 PM 02/08/2019

## 2019-02-09 NOTE — Patient Instructions (Signed)
1. Baden-Walker grade 1 rectocele Mildly symptomatic grade 1 out of 3 rectocele.  Precautions to avoid further progression of the rectocele discussed with patient.  Will work on stools to keep them slightly softer while not having incontinence and will avoid excessive pushing.  Decision to observe at this point.  If becomes more symptomatic, will follow-up to reevaluate management.  Surgical correction of rectocele and pessary management reviewed thoroughly with patient.  Lonny, it was a pleasure meeting you today!

## 2020-10-23 DIAGNOSIS — Z23 Encounter for immunization: Secondary | ICD-10-CM | POA: Diagnosis not present

## 2020-10-23 DIAGNOSIS — I10 Essential (primary) hypertension: Secondary | ICD-10-CM | POA: Diagnosis not present

## 2020-10-23 DIAGNOSIS — E785 Hyperlipidemia, unspecified: Secondary | ICD-10-CM | POA: Diagnosis not present

## 2020-10-23 DIAGNOSIS — Z0000 Encounter for general adult medical examination without abnormal findings: Secondary | ICD-10-CM | POA: Diagnosis not present

## 2020-10-23 DIAGNOSIS — M5137 Other intervertebral disc degeneration, lumbosacral region: Secondary | ICD-10-CM | POA: Diagnosis not present

## 2020-10-23 DIAGNOSIS — F330 Major depressive disorder, recurrent, mild: Secondary | ICD-10-CM | POA: Diagnosis not present

## 2020-10-23 DIAGNOSIS — Z1211 Encounter for screening for malignant neoplasm of colon: Secondary | ICD-10-CM | POA: Diagnosis not present

## 2020-10-23 DIAGNOSIS — M1990 Unspecified osteoarthritis, unspecified site: Secondary | ICD-10-CM | POA: Diagnosis not present

## 2021-01-05 IMAGING — US ULTRASOUND LEFT BREAST LIMITED
1 series · 13 of 13 positions shown · non-contrast
Comparison: 01/07/2019 and earlier

CLINICAL DATA: Patient returns after screening study for evaluation
of possible asymmetries bilaterally.

EXAM:
DIGITAL DIAGNOSTIC BILATERAL MAMMOGRAM WITH CAD AND TOMO
ULTRASOUND BILATERAL BREAST

[Series 1: ultrasound left breast limited · 0.06mm/px · 13 of 13 slices shown]
[im 1/13]
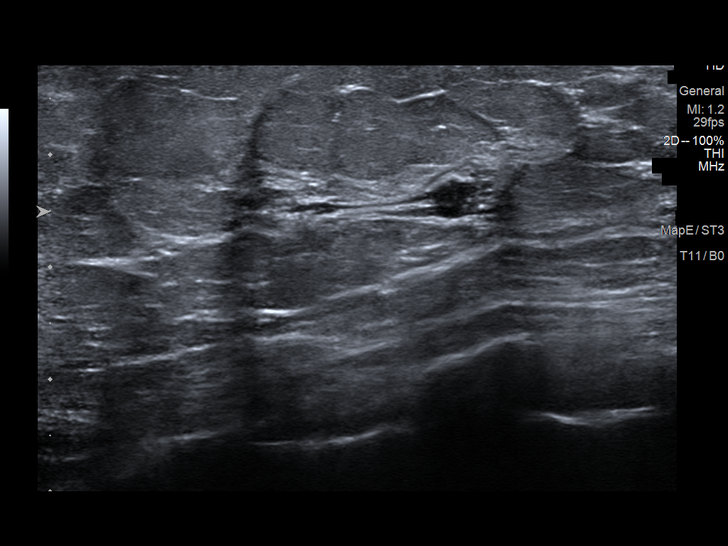
[im 2/13]
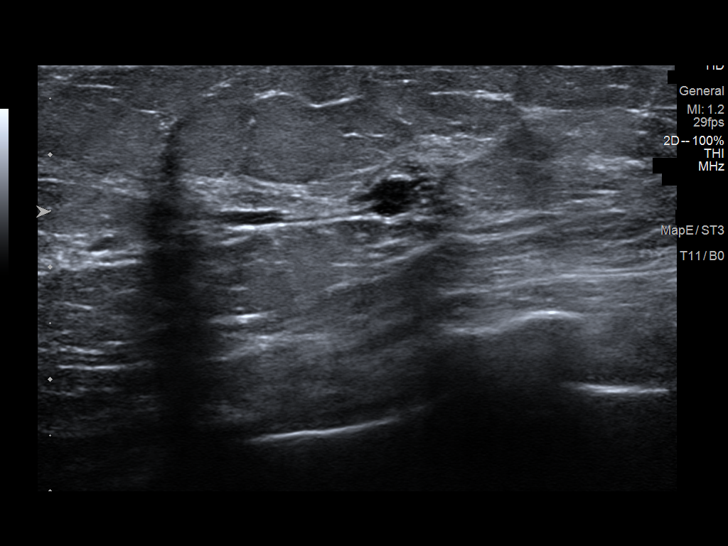
[im 3/13]
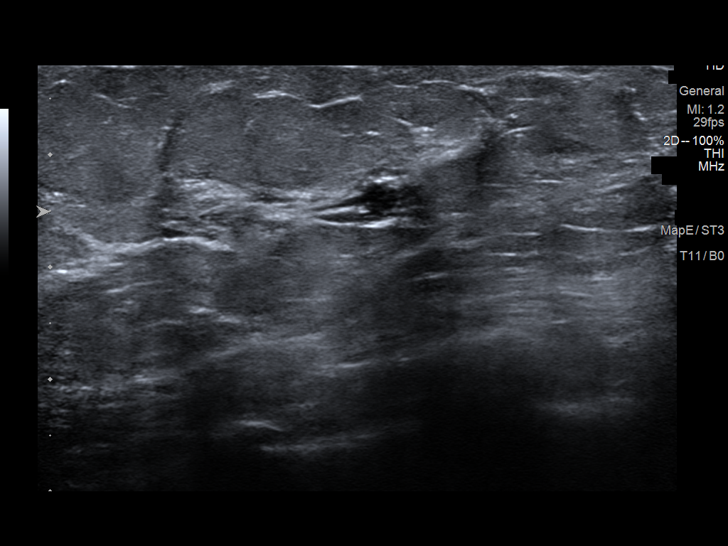
[im 4/13]
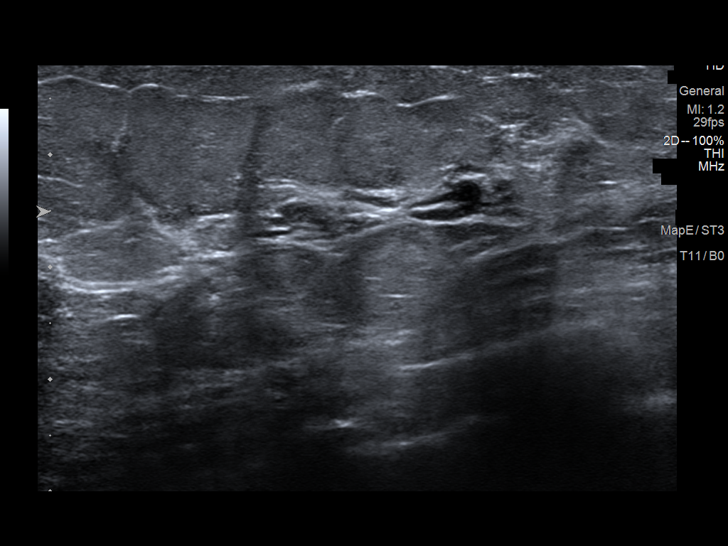
[im 5/13]
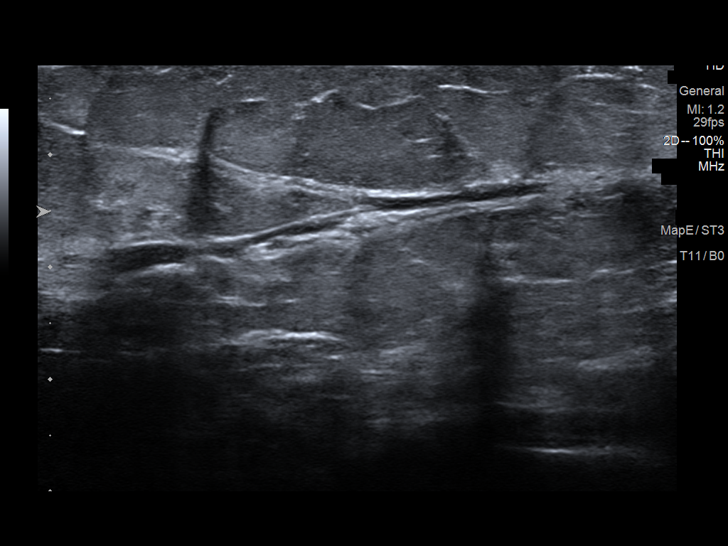
[im 6/13]
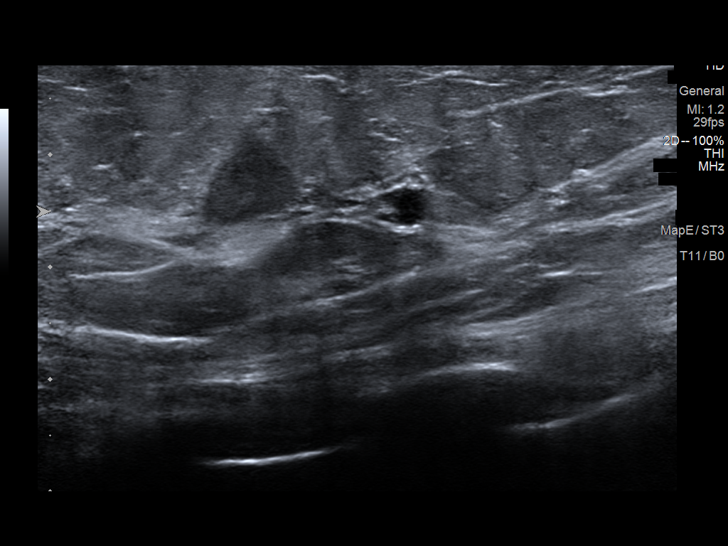
[im 7/13]
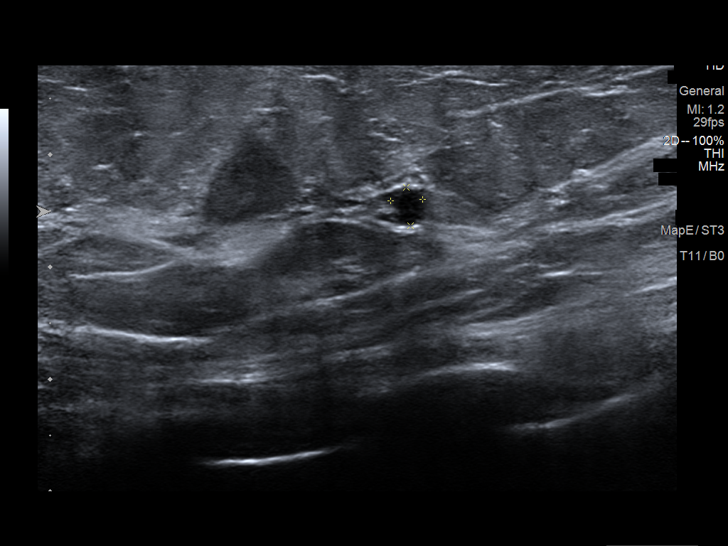
[im 8/13]
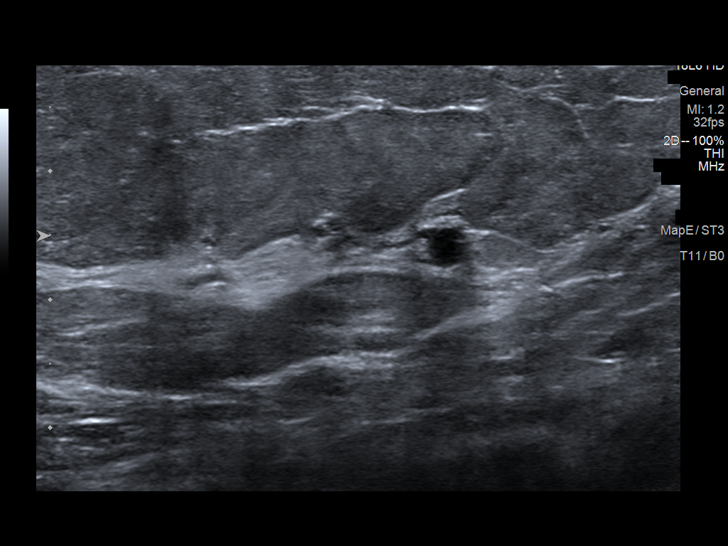
[im 9/13]
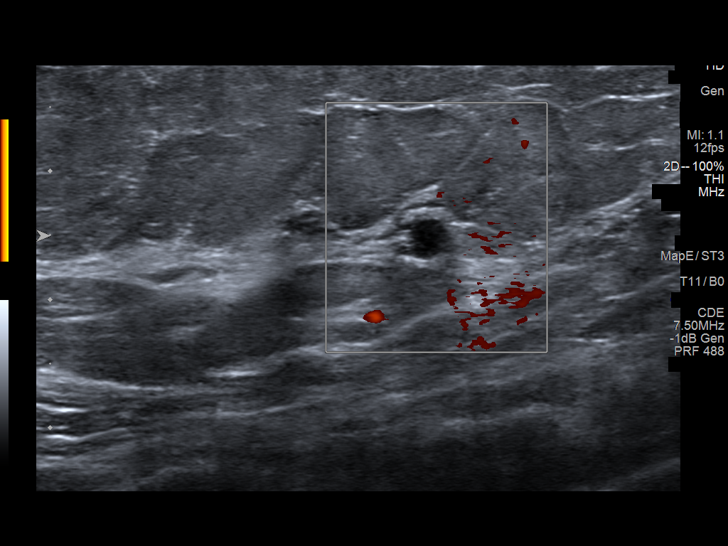
[im 10/13]
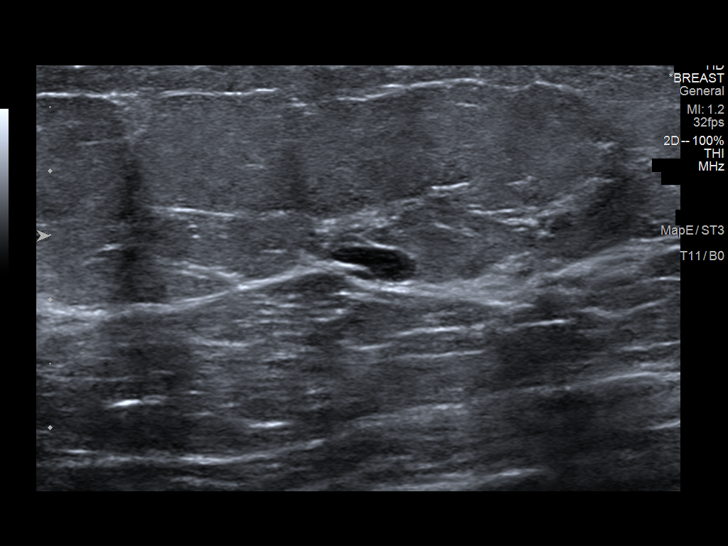
[im 11/13]
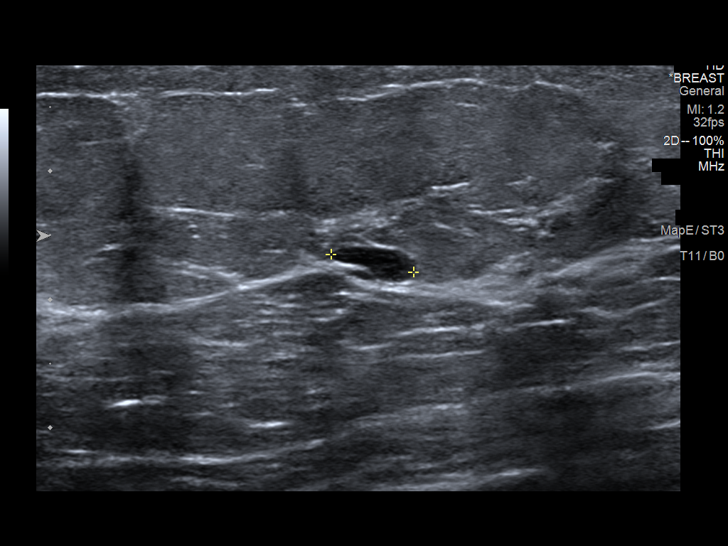
[im 12/13]
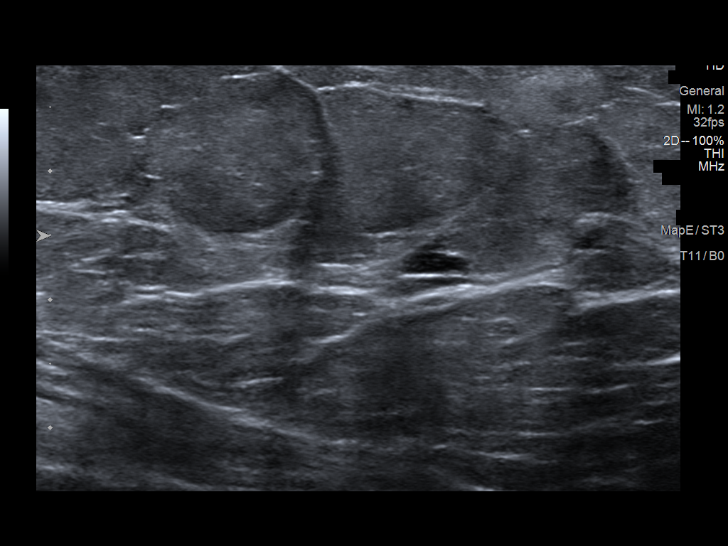
[im 13/13]
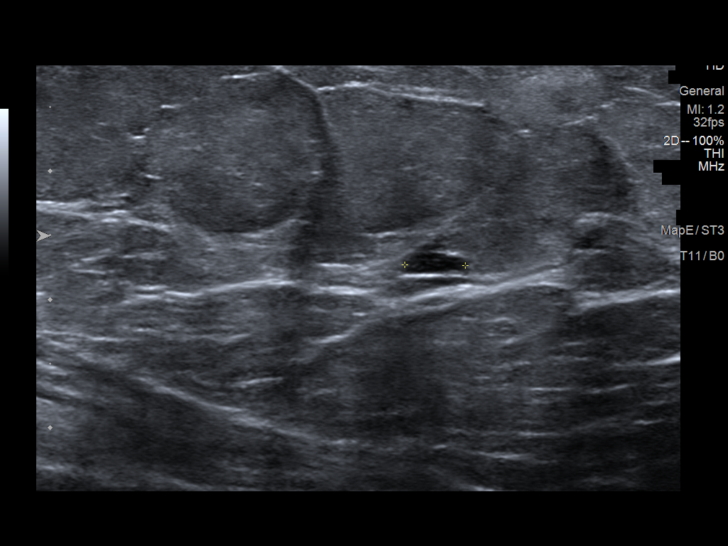

[13 of 13 positions shown; findings below may reference images not displayed]

ACR Breast Density Category c: The breast tissue is heterogeneously
dense, which may obscure small masses.
FINDINGS: Right breast:

Mammogram: Additional 2-D and 3-D images are performed. No discrete
mass identified in the LATERAL aspect of the RIGHT breast.
Mammographic images were processed with CAD.

Ultrasound: Targeted ultrasound is performed, showing normal
appearance of the LATERAL portion of the RIGHT breast. No suspicious
mass, distortion, or acoustic shadowing is demonstrated with
ultrasound.

Left breast:

Mammogram: Additional 2-D and 3-D images are performed. A persistent
circumscribed oval mass is identified in the UPPER OUTER QUADRANT of
the LEFT breast. Mammographic images were processed with CAD.

Ultrasound: Targeted ultrasound is performed, showing a simple cyst
in the 2 o'clock location of the LEFT breast 3 centimeters from the
nipple measuring 0.7 x 0.5 centimeters. Benign mildly tortuous duct
is also identified in the 2:30 o'clock location of the LEFT breast.
IMPRESSION: No mammographic or ultrasound evidence for malignancy.

Small LEFT breast cyst. RIGHT breast is negative on further
evaluation.

RECOMMENDATION:
Screening mammogram in one year.(Code:AU-O-2US)

I have discussed the findings and recommendations with the patient.
Results were also provided in writing at the conclusion of the
visit. If applicable, a reminder letter will be sent to the patient
regarding the next appointment.

BI-RADS CATEGORY  2: Benign.

## 2021-03-06 DIAGNOSIS — K219 Gastro-esophageal reflux disease without esophagitis: Secondary | ICD-10-CM | POA: Diagnosis not present

## 2021-03-06 DIAGNOSIS — M1990 Unspecified osteoarthritis, unspecified site: Secondary | ICD-10-CM | POA: Diagnosis not present

## 2021-03-06 DIAGNOSIS — I10 Essential (primary) hypertension: Secondary | ICD-10-CM | POA: Diagnosis not present

## 2021-03-06 DIAGNOSIS — F330 Major depressive disorder, recurrent, mild: Secondary | ICD-10-CM | POA: Diagnosis not present

## 2021-03-06 DIAGNOSIS — E785 Hyperlipidemia, unspecified: Secondary | ICD-10-CM | POA: Diagnosis not present

## 2021-03-06 DIAGNOSIS — G4700 Insomnia, unspecified: Secondary | ICD-10-CM | POA: Diagnosis not present

## 2021-04-16 DIAGNOSIS — L820 Inflamed seborrheic keratosis: Secondary | ICD-10-CM | POA: Diagnosis not present

## 2021-04-16 DIAGNOSIS — Z85828 Personal history of other malignant neoplasm of skin: Secondary | ICD-10-CM | POA: Diagnosis not present

## 2021-04-16 DIAGNOSIS — L821 Other seborrheic keratosis: Secondary | ICD-10-CM | POA: Diagnosis not present

## 2021-09-10 DIAGNOSIS — Z23 Encounter for immunization: Secondary | ICD-10-CM | POA: Diagnosis not present

## 2021-11-18 DIAGNOSIS — Z1211 Encounter for screening for malignant neoplasm of colon: Secondary | ICD-10-CM | POA: Diagnosis not present

## 2021-11-18 DIAGNOSIS — Z23 Encounter for immunization: Secondary | ICD-10-CM | POA: Diagnosis not present

## 2021-11-18 DIAGNOSIS — K219 Gastro-esophageal reflux disease without esophagitis: Secondary | ICD-10-CM | POA: Diagnosis not present

## 2021-11-18 DIAGNOSIS — I10 Essential (primary) hypertension: Secondary | ICD-10-CM | POA: Diagnosis not present

## 2021-11-18 DIAGNOSIS — E785 Hyperlipidemia, unspecified: Secondary | ICD-10-CM | POA: Diagnosis not present

## 2021-11-18 DIAGNOSIS — M1990 Unspecified osteoarthritis, unspecified site: Secondary | ICD-10-CM | POA: Diagnosis not present

## 2021-11-18 DIAGNOSIS — G8929 Other chronic pain: Secondary | ICD-10-CM | POA: Diagnosis not present

## 2021-11-18 DIAGNOSIS — G4700 Insomnia, unspecified: Secondary | ICD-10-CM | POA: Diagnosis not present

## 2021-11-18 DIAGNOSIS — Z0000 Encounter for general adult medical examination without abnormal findings: Secondary | ICD-10-CM | POA: Diagnosis not present

## 2022-01-26 DIAGNOSIS — S8012XA Contusion of left lower leg, initial encounter: Secondary | ICD-10-CM | POA: Diagnosis not present

## 2022-01-26 DIAGNOSIS — S065X0A Traumatic subdural hemorrhage without loss of consciousness, initial encounter: Secondary | ICD-10-CM | POA: Diagnosis not present

## 2022-01-26 DIAGNOSIS — S065X9A Traumatic subdural hemorrhage with loss of consciousness of unspecified duration, initial encounter: Secondary | ICD-10-CM | POA: Diagnosis not present

## 2022-01-26 DIAGNOSIS — S0083XA Contusion of other part of head, initial encounter: Secondary | ICD-10-CM | POA: Diagnosis not present

## 2022-01-26 DIAGNOSIS — I6523 Occlusion and stenosis of bilateral carotid arteries: Secondary | ICD-10-CM | POA: Diagnosis not present

## 2022-01-26 DIAGNOSIS — W1839XA Other fall on same level, initial encounter: Secondary | ICD-10-CM | POA: Diagnosis not present

## 2022-01-26 DIAGNOSIS — I700 Atherosclerosis of aorta: Secondary | ICD-10-CM | POA: Diagnosis not present

## 2022-01-26 DIAGNOSIS — Z79899 Other long term (current) drug therapy: Secondary | ICD-10-CM | POA: Diagnosis not present

## 2022-01-26 DIAGNOSIS — D72829 Elevated white blood cell count, unspecified: Secondary | ICD-10-CM | POA: Diagnosis not present

## 2022-01-26 DIAGNOSIS — Y998 Other external cause status: Secondary | ICD-10-CM | POA: Diagnosis not present

## 2022-01-26 DIAGNOSIS — S0993XA Unspecified injury of face, initial encounter: Secondary | ICD-10-CM | POA: Diagnosis not present

## 2022-01-26 DIAGNOSIS — S0003XA Contusion of scalp, initial encounter: Secondary | ICD-10-CM | POA: Diagnosis not present

## 2022-01-26 DIAGNOSIS — M542 Cervicalgia: Secondary | ICD-10-CM | POA: Diagnosis not present

## 2022-01-26 DIAGNOSIS — K449 Diaphragmatic hernia without obstruction or gangrene: Secondary | ICD-10-CM | POA: Diagnosis not present

## 2022-01-26 DIAGNOSIS — M8588 Other specified disorders of bone density and structure, other site: Secondary | ICD-10-CM | POA: Diagnosis not present

## 2022-01-26 DIAGNOSIS — W19XXXA Unspecified fall, initial encounter: Secondary | ICD-10-CM | POA: Diagnosis not present

## 2022-01-26 DIAGNOSIS — S3992XA Unspecified injury of lower back, initial encounter: Secondary | ICD-10-CM | POA: Diagnosis not present

## 2022-01-26 DIAGNOSIS — S3993XA Unspecified injury of pelvis, initial encounter: Secondary | ICD-10-CM | POA: Diagnosis not present

## 2022-01-26 DIAGNOSIS — S299XXA Unspecified injury of thorax, initial encounter: Secondary | ICD-10-CM | POA: Diagnosis not present

## 2022-01-26 DIAGNOSIS — S065XAA Traumatic subdural hemorrhage with loss of consciousness status unknown, initial encounter: Secondary | ICD-10-CM | POA: Diagnosis not present

## 2022-01-26 DIAGNOSIS — R000 Tachycardia, unspecified: Secondary | ICD-10-CM | POA: Diagnosis not present

## 2022-01-26 DIAGNOSIS — S0990XA Unspecified injury of head, initial encounter: Secondary | ICD-10-CM | POA: Diagnosis not present

## 2022-01-26 DIAGNOSIS — R4182 Altered mental status, unspecified: Secondary | ICD-10-CM | POA: Diagnosis not present

## 2022-01-26 DIAGNOSIS — E871 Hypo-osmolality and hyponatremia: Secondary | ICD-10-CM | POA: Diagnosis not present

## 2022-01-26 DIAGNOSIS — S3991XA Unspecified injury of abdomen, initial encounter: Secondary | ICD-10-CM | POA: Diagnosis not present

## 2022-01-26 DIAGNOSIS — S199XXA Unspecified injury of neck, initial encounter: Secondary | ICD-10-CM | POA: Diagnosis not present

## 2022-01-30 DIAGNOSIS — G4700 Insomnia, unspecified: Secondary | ICD-10-CM | POA: Diagnosis not present

## 2022-01-30 DIAGNOSIS — Z79899 Other long term (current) drug therapy: Secondary | ICD-10-CM | POA: Diagnosis not present

## 2022-03-03 DIAGNOSIS — G319 Degenerative disease of nervous system, unspecified: Secondary | ICD-10-CM | POA: Diagnosis not present

## 2022-03-03 DIAGNOSIS — S065XAD Traumatic subdural hemorrhage with loss of consciousness status unknown, subsequent encounter: Secondary | ICD-10-CM | POA: Diagnosis not present

## 2022-03-11 DIAGNOSIS — Z79899 Other long term (current) drug therapy: Secondary | ICD-10-CM | POA: Diagnosis not present

## 2022-03-11 DIAGNOSIS — G4700 Insomnia, unspecified: Secondary | ICD-10-CM | POA: Diagnosis not present

## 2022-03-17 ENCOUNTER — Other Ambulatory Visit: Payer: Self-pay | Admitting: Family Medicine

## 2022-03-17 DIAGNOSIS — E785 Hyperlipidemia, unspecified: Secondary | ICD-10-CM | POA: Diagnosis not present

## 2022-03-17 DIAGNOSIS — M8580 Other specified disorders of bone density and structure, unspecified site: Secondary | ICD-10-CM

## 2022-03-17 DIAGNOSIS — G8929 Other chronic pain: Secondary | ICD-10-CM | POA: Diagnosis not present

## 2022-03-17 DIAGNOSIS — I10 Essential (primary) hypertension: Secondary | ICD-10-CM | POA: Diagnosis not present

## 2022-03-17 DIAGNOSIS — K219 Gastro-esophageal reflux disease without esophagitis: Secondary | ICD-10-CM | POA: Diagnosis not present

## 2022-04-14 DIAGNOSIS — G4700 Insomnia, unspecified: Secondary | ICD-10-CM | POA: Diagnosis not present

## 2022-04-14 DIAGNOSIS — Z79899 Other long term (current) drug therapy: Secondary | ICD-10-CM | POA: Diagnosis not present

## 2022-04-16 DIAGNOSIS — Z79899 Other long term (current) drug therapy: Secondary | ICD-10-CM | POA: Diagnosis not present

## 2022-05-19 DIAGNOSIS — M1990 Unspecified osteoarthritis, unspecified site: Secondary | ICD-10-CM | POA: Diagnosis not present

## 2022-05-19 DIAGNOSIS — G4700 Insomnia, unspecified: Secondary | ICD-10-CM | POA: Diagnosis not present

## 2022-06-18 DIAGNOSIS — M1990 Unspecified osteoarthritis, unspecified site: Secondary | ICD-10-CM | POA: Diagnosis not present

## 2022-06-18 DIAGNOSIS — Z79899 Other long term (current) drug therapy: Secondary | ICD-10-CM | POA: Diagnosis not present

## 2022-06-18 DIAGNOSIS — G4700 Insomnia, unspecified: Secondary | ICD-10-CM | POA: Diagnosis not present

## 2022-07-18 DIAGNOSIS — Z1231 Encounter for screening mammogram for malignant neoplasm of breast: Secondary | ICD-10-CM | POA: Diagnosis not present

## 2022-07-18 DIAGNOSIS — G4700 Insomnia, unspecified: Secondary | ICD-10-CM | POA: Diagnosis not present

## 2022-07-18 DIAGNOSIS — Z79899 Other long term (current) drug therapy: Secondary | ICD-10-CM | POA: Diagnosis not present

## 2022-07-18 DIAGNOSIS — M1990 Unspecified osteoarthritis, unspecified site: Secondary | ICD-10-CM | POA: Diagnosis not present

## 2022-07-22 ENCOUNTER — Other Ambulatory Visit: Payer: Self-pay | Admitting: Physician Assistant

## 2022-07-22 DIAGNOSIS — Z1231 Encounter for screening mammogram for malignant neoplasm of breast: Secondary | ICD-10-CM

## 2022-07-23 DIAGNOSIS — Z79899 Other long term (current) drug therapy: Secondary | ICD-10-CM | POA: Diagnosis not present

## 2022-07-24 DIAGNOSIS — H25813 Combined forms of age-related cataract, bilateral: Secondary | ICD-10-CM | POA: Diagnosis not present

## 2022-07-24 DIAGNOSIS — H52203 Unspecified astigmatism, bilateral: Secondary | ICD-10-CM | POA: Diagnosis not present

## 2022-08-11 DIAGNOSIS — K5900 Constipation, unspecified: Secondary | ICD-10-CM | POA: Diagnosis not present

## 2022-08-11 DIAGNOSIS — R112 Nausea with vomiting, unspecified: Secondary | ICD-10-CM | POA: Diagnosis not present

## 2022-08-11 DIAGNOSIS — K449 Diaphragmatic hernia without obstruction or gangrene: Secondary | ICD-10-CM | POA: Diagnosis not present

## 2022-08-11 DIAGNOSIS — K589 Irritable bowel syndrome without diarrhea: Secondary | ICD-10-CM | POA: Diagnosis not present

## 2022-08-15 DIAGNOSIS — N644 Mastodynia: Secondary | ICD-10-CM | POA: Diagnosis not present

## 2022-08-18 DIAGNOSIS — M1990 Unspecified osteoarthritis, unspecified site: Secondary | ICD-10-CM | POA: Diagnosis not present

## 2022-08-18 DIAGNOSIS — Z79899 Other long term (current) drug therapy: Secondary | ICD-10-CM | POA: Diagnosis not present

## 2022-08-18 DIAGNOSIS — G4700 Insomnia, unspecified: Secondary | ICD-10-CM | POA: Diagnosis not present

## 2022-08-19 DIAGNOSIS — Z79899 Other long term (current) drug therapy: Secondary | ICD-10-CM | POA: Diagnosis not present

## 2023-03-25 ENCOUNTER — Ambulatory Visit: Payer: Medicare PPO | Admitting: Urology

## 2023-03-25 ENCOUNTER — Encounter: Payer: Self-pay | Admitting: Urology

## 2023-03-25 VITALS — BP 132/80 | HR 97 | Ht 60.0 in | Wt 134.0 lb

## 2023-03-25 DIAGNOSIS — N3010 Interstitial cystitis (chronic) without hematuria: Secondary | ICD-10-CM | POA: Diagnosis not present

## 2023-03-25 DIAGNOSIS — R339 Retention of urine, unspecified: Secondary | ICD-10-CM

## 2023-03-25 DIAGNOSIS — R3989 Other symptoms and signs involving the genitourinary system: Secondary | ICD-10-CM

## 2023-03-25 DIAGNOSIS — Z87440 Personal history of urinary (tract) infections: Secondary | ICD-10-CM

## 2023-03-25 LAB — URINALYSIS, ROUTINE W REFLEX MICROSCOPIC
Bilirubin, UA: NEGATIVE
Glucose, UA: NEGATIVE
Ketones, UA: NEGATIVE
Leukocytes,UA: NEGATIVE
Nitrite, UA: NEGATIVE
Protein,UA: NEGATIVE
RBC, UA: NEGATIVE
Specific Gravity, UA: 1.02 (ref 1.005–1.030)
Urobilinogen, Ur: 0.2 mg/dL (ref 0.2–1.0)
pH, UA: 6 (ref 5.0–7.5)

## 2023-03-25 LAB — BLADDER SCAN AMB NON-IMAGING

## 2023-03-25 MED ORDER — MIRABEGRON ER 25 MG PO TB24: 25.0000 mg | ORAL_TABLET | Freq: Every day | ORAL | 0 refills | Status: AC

## 2023-03-25 NOTE — Progress Notes (Signed)
Assessment: 1. Bladder pain   2. History of UTI   3. Interstitial cystitis   4. Incomplete bladder emptying    Plan: I personally reviewed the patient's chart including provider notes, lab and imaging results. I reviewed the patient records from [facility] Timed and double voiding to ensure bladder emptying Trial of Myrbetriq 25 mg daily.  Samples given Return to office in 1 month   Chief Complaint:  Chief Complaint  Patient presents with   Pelvic Pain   Urinary Tract Infection    History of Present Illness:  Tamara Aguilar is a 77 y.o. female who is seen in consultation from Cheri Kearns, NP for evaluation of lower urinary tract symptoms and bladder pain.   She has previously seen Dr. Sherron Monday and was diagnosed with chronic interstitial cystitis. She had cystoscopy with hydrodistention with bladder instillation in August 2013.    Her symptoms had improved at follow-up in October 2013.  She did not follow-up after that visit.  She reports that she was doing fairly well until the past month when she noted an increase in her urinary symptoms.  She has urinary frequency, voiding every 2 hours, nocturia every 3 hours, some urgency, and dysuria.  She also has discomfort in the suprapubic area and the bilateral pelvic area.  She has stress and urge incontinence.  She has a also noted low back pain.  No gross hematuria.   Urine culture from 01/27/2023 grew 25-50 K mixed flora.  She was treated with several rounds of antibiotics without improvement in her symptoms. CT imaging from 03/17/2023 showed a large hiatal hernia, mild perinephric stranding bilaterally, normal-appearing bladder. She had been taking AZO with improvement in her symptoms.  She reports that she is adhering to the IC diet.   Past Medical History:  Past Medical History:  Diagnosis Date   Arthritis knees, hips, hands, feet    Chronic joint pain due to arthritis   DDD (degenerative disc disease), cervical     Frequency of urination    GERD (gastroesophageal reflux disease)    H/O hiatal hernia    Hypertension    IBS (irritable bowel syndrome)    Nocturia    Pelvic pain in female    Urgency of urination     Past Surgical History:  Past Surgical History:  Procedure Laterality Date   ABDOMINAL HYSTERECTOMY  AGE 67   AND APPENDECTOMY (PELVIC PROLAPSE AND ENDOMETRIOSIS)   BASAL CELL CARCINOMA EXCISION  2009   FOREHEAD   CARDIOVASCULAR STRESS TEST  03-11-2010  DR KATZ   NORMAL NUCLEAR STUDY/ EF 70%   CHOLECYSTECTOMY  1992   CYSTO WITH HYDRODISTENSION  07/08/2012   Procedure: CYSTOSCOPY/HYDRODISTENSION;  Surgeon: Martina Sinner, MD;  Location: [facility];  Service: Urology;  Laterality: N/A;  cysto, hod and instillation m&p     DILATION AND CURETTAGE OF UTERUS  X3   LAPAROTOMY BILATERAL SALPINGOOPHECTOMY AND MARSHALL-MARCHETTI-KRANTZ PROCEDURE  AGE 78   URINARY INCONTINENCE   TONSILLECTOMY AND ADENOIDECTOMY  CHILD   TRANSTHORACIC ECHOCARDIOGRAM  03-01-2010   MILD LVH/ LVSF NORMAL/ EF 55-60%    Allergies:  Allergies  Allergen Reactions   Azithromycin Nausea And Vomiting    Severe n/v   Codeine Nausea And Vomiting   Levofloxacin Nausea And Vomiting    Severe n/v   Morphine Nausea And Vomiting    Severe n/v   Red Dye Hives   Synthroid [Levothyroxine Sodium] Other (See Comments)    "Caused tachycardia and  the shakes"   Amoxicillin Rash   Benzonatate Palpitations    (tessalon) rapid heart beat    Caffeine Palpitations   Guaifenesin Palpitations    Rapid heart beat   Imipramine Palpitations    Rapid heart beat   Tetracycline Swelling and Rash    Family History:  Family History  Problem Relation Age of Onset   Hypertension Mother    Stroke Mother    Heart disease Mother    Cancer Father    Hypertension Father    Cancer Brother    Cancer Paternal Aunt     Social History:  Social History   Tobacco Use   Smoking status: Never   Smokeless tobacco:  Never  Vaping Use   Vaping Use: Never used  Substance Use Topics   Alcohol use: No   Drug use: No    Review of symptoms:  Constitutional:  Negative for unexplained weight loss, night sweats, fever, chills ENT:  Negative for nose bleeds, sinus pain, painful swallowing CV:  Negative for chest pain, shortness of breath, exercise intolerance, palpitations, loss of consciousness Resp:  Negative for cough, wheezing, shortness of breath GI:  Negative for nausea, vomiting, diarrhea, bloody stools GU:  Positives noted in HPI; otherwise negative for gross hematuria Neuro:  Negative for seizures, poor balance, limb weakness, slurred speech Psych:  Negative for lack of energy, depression, anxiety Endocrine:  Negative for polydipsia, polyuria, symptoms of hypoglycemia (dizziness, hunger, sweating) Hematologic:  Negative for anemia, purpura, petechia, prolonged or excessive bleeding, use of anticoagulants  Allergic:  Negative for difficulty breathing or choking as a result of exposure to anything; no shellfish allergy; no allergic response (rash/itch) to materials, foods  Physical exam: BP 132/80   Pulse 97   Ht 5' (1.524 m)   Wt 134 lb (60.8 kg)   BMI 26.17 kg/m  GENERAL APPEARANCE:  Well appearing, well developed, well nourished, NAD HEENT: Atraumatic, Normocephalic, oropharynx clear. NECK: Supple without lymphadenopathy or thyromegaly. LUNGS: Clear to auscultation bilaterally. HEART: Regular Rate and Rhythm without murmurs, gallops, or rubs. ABDOMEN: Soft, non-tender, No Masses. EXTREMITIES: Moves all extremities well.  Without clubbing, cyanosis, or edema. NEUROLOGIC:  Alert and oriented x 3, normal gait, CN II-XII grossly intact.  MENTAL STATUS:  Appropriate. BACK:  Non-tender to palpation.  No CVAT SKIN:  Warm, dry and intact.    Results: U/A:  negative  PVR: 216 ml

## 2023-03-26 ENCOUNTER — Encounter: Payer: Self-pay | Admitting: Physician Assistant

## 2023-04-24 ENCOUNTER — Ambulatory Visit: Payer: Medicare PPO | Admitting: Urology

## 2023-04-24 ENCOUNTER — Encounter: Payer: Self-pay | Admitting: Urology

## 2023-04-24 VITALS — BP 137/89 | HR 98 | Ht 60.0 in | Wt 135.0 lb

## 2023-04-24 DIAGNOSIS — R3989 Other symptoms and signs involving the genitourinary system: Secondary | ICD-10-CM

## 2023-04-24 DIAGNOSIS — R339 Retention of urine, unspecified: Secondary | ICD-10-CM

## 2023-04-24 DIAGNOSIS — N3010 Interstitial cystitis (chronic) without hematuria: Secondary | ICD-10-CM | POA: Diagnosis not present

## 2023-04-24 DIAGNOSIS — Z87440 Personal history of urinary (tract) infections: Secondary | ICD-10-CM

## 2023-04-24 LAB — URINALYSIS, ROUTINE W REFLEX MICROSCOPIC
Bilirubin, UA: NEGATIVE
Glucose, UA: NEGATIVE
Ketones, UA: NEGATIVE
Nitrite, UA: NEGATIVE
Protein,UA: NEGATIVE
RBC, UA: NEGATIVE
Specific Gravity, UA: 1.015 (ref 1.005–1.030)
Urobilinogen, Ur: 0.2 mg/dL (ref 0.2–1.0)
pH, UA: 6.5 (ref 5.0–7.5)

## 2023-04-24 LAB — BLADDER SCAN AMB NON-IMAGING

## 2023-04-24 LAB — MICROSCOPIC EXAMINATION
Cast Type: NONE SEEN
Casts: NONE SEEN /lpf
Crystal Type: NONE SEEN
Crystals: NONE SEEN
Mucus, UA: NONE SEEN
RBC, Urine: NONE SEEN /hpf (ref 0–2)
Trichomonas, UA: NONE SEEN
Yeast, UA: NONE SEEN

## 2023-04-24 NOTE — Progress Notes (Signed)
Assessment: 1. Bladder pain   2. History of UTI   3. Interstitial cystitis   4. Incomplete bladder emptying     Plan: Continue timed and double voiding to ensure bladder emptying She is not interested in trying any other medical therapy for her symptoms at the present time. Return to office in 3 months.  Chief Complaint:  Chief Complaint  Patient presents with   Bladder pain    History of Present Illness:  Tamara Aguilar is a 77 y.o. female who is seen for further evaluation of lower urinary tract symptoms and bladder pain.   She has previously seen Dr. Sherron Monday and was diagnosed with chronic interstitial cystitis. She had cystoscopy with hydrodistention with bladder instillation in August 2013.    Her symptoms had improved at follow-up in October 2013.  She did not follow-up after that visit.  She reported that she was doing fairly well until April 2024 when she noted an increase in her urinary symptoms.  She had urinary frequency, voiding every 2 hours, nocturia every 3 hours, some urgency, and dysuria.  She also has discomfort in the suprapubic area and the bilateral pelvic area.  She has a history of stress and urge incontinence.  She has a also noted low back pain.  No gross hematuria.   Urine culture from 01/27/2023 grew 25-50 K mixed flora.  She was treated with several rounds of antibiotics without improvement in her symptoms. CT imaging from 03/17/2023 showed a large hiatal hernia, mild perinephric stranding bilaterally, normal-appearing bladder. She had been taking AZO with improvement in her symptoms.  She reports that she is adhering to the IC diet. PVR 4/24: 216 ml  She was given a trial of Myrbetriq 25 mg daily at her visit in April 2024. She returns today for follow-up.  She was unable to tolerate the Myrbetriq due to nausea and vomiting. She has had decreased bladder pain.  She continues with urinary frequency, occasional urgency, occasional dysuria and  incontinence.  She is currently taking her hydrochlorothiazide in divided doses which allows her to empty the bladder more completely.  Portions of the above documentation were copied from a prior visit for review purposes only.   Past Medical History:  Past Medical History:  Diagnosis Date   Arthritis knees, hips, hands, feet    Chronic joint pain due to arthritis   DDD (degenerative disc disease), cervical    Frequency of urination    GERD (gastroesophageal reflux disease)    H/O hiatal hernia    Hypertension    IBS (irritable bowel syndrome)    Nocturia    Pelvic pain in female    Urgency of urination     Past Surgical History:  Past Surgical History:  Procedure Laterality Date   ABDOMINAL HYSTERECTOMY  AGE 11   AND APPENDECTOMY (PELVIC PROLAPSE AND ENDOMETRIOSIS)   BASAL CELL CARCINOMA EXCISION  2009   FOREHEAD   CARDIOVASCULAR STRESS TEST  03-11-2010  DR KATZ   NORMAL NUCLEAR STUDY/ EF 70%   CHOLECYSTECTOMY  1992   CYSTO WITH HYDRODISTENSION  07/08/2012   Procedure: CYSTOSCOPY/HYDRODISTENSION;  Surgeon: Martina Sinner, MD;  Location: [facility];  Service: Urology;  Laterality: N/A;  cysto, hod and instillation m&p     DILATION AND CURETTAGE OF UTERUS  X3   LAPAROTOMY BILATERAL SALPINGOOPHECTOMY AND MARSHALL-MARCHETTI-KRANTZ PROCEDURE  AGE 56   URINARY INCONTINENCE   TONSILLECTOMY AND ADENOIDECTOMY  CHILD   TRANSTHORACIC ECHOCARDIOGRAM  03-01-2010   MILD  LVH/ LVSF NORMAL/ EF 55-60%    Allergies:  Allergies  Allergen Reactions   Azithromycin Nausea And Vomiting    Severe n/v   Codeine Nausea And Vomiting   Levofloxacin Nausea And Vomiting    Severe n/v   Morphine Nausea And Vomiting    Severe n/v   Red Dye Hives   Synthroid [Levothyroxine Sodium] Other (See Comments)    "Caused tachycardia and the shakes"   Amoxicillin Rash   Benzonatate Palpitations    (tessalon) rapid heart beat    Caffeine Palpitations   Guaifenesin Palpitations     Rapid heart beat   Imipramine Palpitations    Rapid heart beat   Tetracycline Swelling and Rash    Family History:  Family History  Problem Relation Age of Onset   Hypertension Mother    Stroke Mother    Heart disease Mother    Cancer Father    Hypertension Father    Cancer Brother    Cancer Paternal Aunt     Social History:  Social History   Tobacco Use   Smoking status: Never   Smokeless tobacco: Never  Vaping Use   Vaping Use: Never used  Substance Use Topics   Alcohol use: No   Drug use: No    ROS: Constitutional:  Negative for fever, chills, weight loss CV: Negative for chest pain, previous MI, hypertension Respiratory:  Negative for shortness of breath, wheezing, sleep apnea, frequent cough GI:  Negative for nausea, vomiting, bloody stool, GERD  Physical exam: There were no vitals taken for this visit. GENERAL APPEARANCE:  Well appearing, well developed, well nourished, NAD HEENT:  Atraumatic, normocephalic, oropharynx clear NECK:  Supple without lymphadenopathy or thyromegaly ABDOMEN:  Soft, non-tender, no masses EXTREMITIES:  Moves all extremities well, without clubbing, cyanosis, or edema NEUROLOGIC:  Alert and oriented x 3, normal gait, CN II-XII grossly intact MENTAL STATUS:  appropriate BACK:  Non-tender to palpation, No CVAT SKIN:  Warm, dry, and intact  Results: U/A: 0-5 WBC, many bacteria  PVR:  155 ml

## 2023-07-23 ENCOUNTER — Ambulatory Visit: Payer: Medicare PPO | Admitting: Urology

## 2023-07-23 NOTE — Progress Notes (Deleted)
Assessment: 1. Bladder pain   2. History of UTI   3. Interstitial cystitis   4. Incomplete bladder emptying      Plan: Continue timed and double voiding to ensure bladder emptying She is not interested in trying any other medical therapy for her symptoms at the present time. Return to office in 3 months.  Chief Complaint:  No chief complaint on file.   History of Present Illness:  Tamara Aguilar is a 77 y.o. female who is seen for further evaluation of lower urinary tract symptoms and bladder pain.   She has previously seen Dr. Sherron Monday and was diagnosed with chronic interstitial cystitis. She had cystoscopy with hydrodistention with bladder instillation in August 2013.    Her symptoms had improved at follow-up in October 2013.  She did not follow-up after that visit.  She reported that she was doing fairly well until April 2024 when she noted an increase in her urinary symptoms.  She had urinary frequency, voiding every 2 hours, nocturia every 3 hours, some urgency, and dysuria.  She also has discomfort in the suprapubic area and the bilateral pelvic area.  She has a history of stress and urge incontinence.  She has a also noted low back pain.  No gross hematuria.   Urine culture from 01/27/2023 grew 25-50 K mixed flora.  She was treated with several rounds of antibiotics without improvement in her symptoms. CT imaging from 03/17/2023 showed a large hiatal hernia, mild perinephric stranding bilaterally, normal-appearing bladder. She had been taking AZO with improvement in her symptoms.  She reports that she is adhering to the IC diet. PVR 4/24: 216 ml  She was given a trial of Myrbetriq 25 mg daily at her visit in April 2024. She was unable to tolerate the Myrbetriq due to nausea and vomiting. She noted decreased bladder pain.  She continued with urinary frequency, occasional urgency, occasional dysuria and incontinence.  She was taking her hydrochlorothiazide in divided doses  which allowed her to empty the bladder more completely.  Portions of the above documentation were copied from a prior visit for review purposes only.   Past Medical History:  Past Medical History:  Diagnosis Date   Arthritis knees, hips, hands, feet    Chronic joint pain due to arthritis   DDD (degenerative disc disease), cervical    Frequency of urination    GERD (gastroesophageal reflux disease)    H/O hiatal hernia    Hypertension    IBS (irritable bowel syndrome)    Nocturia    Pelvic pain in female    Urgency of urination     Past Surgical History:  Past Surgical History:  Procedure Laterality Date   ABDOMINAL HYSTERECTOMY  AGE 49   AND APPENDECTOMY (PELVIC PROLAPSE AND ENDOMETRIOSIS)   BASAL CELL CARCINOMA EXCISION  2009   FOREHEAD   CARDIOVASCULAR STRESS TEST  03-11-2010  DR KATZ   NORMAL NUCLEAR STUDY/ EF 70%   CHOLECYSTECTOMY  1992   CYSTO WITH HYDRODISTENSION  07/08/2012   Procedure: CYSTOSCOPY/HYDRODISTENSION;  Surgeon: Martina Sinner, MD;  Location: [facility];  Service: Urology;  Laterality: N/A;  cysto, hod and instillation m&p     DILATION AND CURETTAGE OF UTERUS  X3   LAPAROTOMY BILATERAL SALPINGOOPHECTOMY AND MARSHALL-MARCHETTI-KRANTZ PROCEDURE  AGE 69   URINARY INCONTINENCE   TONSILLECTOMY AND ADENOIDECTOMY  CHILD   TRANSTHORACIC ECHOCARDIOGRAM  03-01-2010   MILD LVH/ LVSF NORMAL/ EF 55-60%    Allergies:  Allergies  Allergen  Reactions   Azithromycin Nausea And Vomiting    Severe n/v   Codeine Nausea And Vomiting   Levofloxacin Nausea And Vomiting    Severe n/v   Morphine Nausea And Vomiting    Severe n/v   Red Dye #40 (Allura Red) Hives   Synthroid [Levothyroxine Sodium] Other (See Comments)    "Caused tachycardia and the shakes"   Amoxicillin Rash   Benzonatate Palpitations    (tessalon) rapid heart beat    Caffeine Palpitations   Guaifenesin Palpitations    Rapid heart beat   Imipramine Palpitations    Rapid heart  beat   Tetracycline Swelling and Rash    Family History:  Family History  Problem Relation Age of Onset   Hypertension Mother    Stroke Mother    Heart disease Mother    Cancer Father    Hypertension Father    Cancer Brother    Cancer Paternal Aunt     Social History:  Social History   Tobacco Use   Smoking status: Never   Smokeless tobacco: Never  Vaping Use   Vaping status: Never Used  Substance Use Topics   Alcohol use: No   Drug use: No    ROS: Constitutional:  Negative for fever, chills, weight loss CV: Negative for chest pain, previous MI, hypertension Respiratory:  Negative for shortness of breath, wheezing, sleep apnea, frequent cough GI:  Negative for nausea, vomiting, bloody stool, GERD  Physical exam: There were no vitals taken for this visit. GENERAL APPEARANCE:  Well appearing, well developed, well nourished, NAD HEENT:  Atraumatic, normocephalic, oropharynx clear NECK:  Supple without lymphadenopathy or thyromegaly ABDOMEN:  Soft, non-tender, no masses EXTREMITIES:  Moves all extremities well, without clubbing, cyanosis, or edema NEUROLOGIC:  Alert and oriented x 3, normal gait, CN II-XII grossly intact MENTAL STATUS:  appropriate BACK:  Non-tender to palpation, No CVAT SKIN:  Warm, dry, and intact  Results: U/A:

## 2023-08-24 ENCOUNTER — Other Ambulatory Visit: Payer: Self-pay

## 2023-08-24 ENCOUNTER — Telehealth: Payer: Self-pay | Admitting: Urology

## 2023-08-24 DIAGNOSIS — Z87440 Personal history of urinary (tract) infections: Secondary | ICD-10-CM

## 2023-08-24 NOTE — Telephone Encounter (Signed)
Pt scheduled for lab appt 08/25/23.

## 2023-08-24 NOTE — Telephone Encounter (Signed)
Patient thinks she has an infection and wants to know if she can drop off urine this week.  [phone]

## 2023-08-25 ENCOUNTER — Telehealth: Payer: Self-pay

## 2023-08-25 ENCOUNTER — Other Ambulatory Visit: Payer: Medicare PPO

## 2023-08-25 DIAGNOSIS — Z87440 Personal history of urinary (tract) infections: Secondary | ICD-10-CM

## 2023-08-25 LAB — URINALYSIS, ROUTINE W REFLEX MICROSCOPIC
Bilirubin, UA: NEGATIVE
Glucose, UA: NEGATIVE
Ketones, UA: NEGATIVE
Nitrite, UA: NEGATIVE
Protein,UA: NEGATIVE
RBC, UA: NEGATIVE
Specific Gravity, UA: 1.015 (ref 1.005–1.030)
Urobilinogen, Ur: 0.2 mg/dL (ref 0.2–1.0)
pH, UA: 7 (ref 5.0–7.5)

## 2023-08-25 NOTE — Telephone Encounter (Signed)
Notified pt as advised, pt wanted to schedule an appt for further evaluation.

## 2023-08-25 NOTE — Telephone Encounter (Signed)
-----   Message from Di Kindle sent at 08/25/2023 11:52 AM EDT ----- Please notify Tamara Aguilar that her urinalysis does not show any evidence of a UTI. If she continues to have symptoms, she will need to make an appointment for further evaluation.

## 2023-09-01 ENCOUNTER — Ambulatory Visit: Payer: Medicare PPO | Admitting: Urology

## 2023-09-01 ENCOUNTER — Encounter: Payer: Self-pay | Admitting: Urology

## 2023-09-01 VITALS — BP 123/79 | HR 93 | Ht 60.0 in | Wt 135.0 lb

## 2023-09-01 DIAGNOSIS — R300 Dysuria: Secondary | ICD-10-CM

## 2023-09-01 DIAGNOSIS — N3010 Interstitial cystitis (chronic) without hematuria: Secondary | ICD-10-CM | POA: Diagnosis not present

## 2023-09-01 DIAGNOSIS — Z87440 Personal history of urinary (tract) infections: Secondary | ICD-10-CM

## 2023-09-01 DIAGNOSIS — R109 Unspecified abdominal pain: Secondary | ICD-10-CM

## 2023-09-01 LAB — URINALYSIS, ROUTINE W REFLEX MICROSCOPIC
Bilirubin, UA: NEGATIVE
Glucose, UA: NEGATIVE
Ketones, UA: NEGATIVE
Leukocytes,UA: NEGATIVE
Nitrite, UA: NEGATIVE
Protein,UA: NEGATIVE
RBC, UA: NEGATIVE
Specific Gravity, UA: 1.015 (ref 1.005–1.030)
Urobilinogen, Ur: 0.2 mg/dL (ref 0.2–1.0)
pH, UA: 6 (ref 5.0–7.5)

## 2023-09-01 NOTE — Progress Notes (Signed)
Assessment: 1. Flank pain   2. History of UTI   3. Interstitial cystitis     Plan: Continue timed and double voiding to ensure bladder emptying Recommend further evaluation with CT renal stone study for evaluation of her intermittent flank pain. Will contact her with results. Continue bladder diet.  Chief Complaint:  Chief Complaint  Patient presents with   Bladder pain    History of Present Illness:  Tamara Aguilar is a 77 y.o. female who is seen for further evaluation of lower urinary tract symptoms and bladder pain.   She has previously seen Dr. Sherron Monday and was diagnosed with chronic interstitial cystitis. She had cystoscopy with hydrodistention with bladder instillation in August 2013.    Her symptoms had improved at follow-up in October 2013.  She did not follow-up after that visit.  She reported that she was doing fairly well until April 2024 when she noted an increase in her urinary symptoms.  She had urinary frequency, voiding every 2 hours, nocturia every 3 hours, some urgency, and dysuria.  She also has discomfort in the suprapubic area and the bilateral pelvic area.  She has a history of stress and urge incontinence.  She has a also noted low back pain.  No gross hematuria.   Urine culture from 01/27/2023 grew 25-50 K mixed flora.  She was treated with several rounds of antibiotics without improvement in her symptoms. CT imaging from 03/17/2023 showed a large hiatal hernia, mild perinephric stranding bilaterally, normal-appearing bladder. She had been taking AZO with improvement in her symptoms.  She reports that she is adhering to the IC diet. PVR 4/24: 216 ml  She was given a trial of Myrbetriq 25 mg daily at her visit in April 2024. She was unable to tolerate the Myrbetriq due to nausea and vomiting. At her visit in 5/24, she reported decreased bladder pain.  She continued with urinary frequency, occasional urgency, occasional dysuria and incontinence.  She was  taking her hydrochlorothiazide in divided doses which allowed her to empty the bladder more completely.  She has had 2 episodes of some flank and back pain within the past several months.  Her initial episode occurred on the right side.  She had right-sided back and flank pain with radiation into the right lower abdomen.  She also had associated dysuria.  Her symptoms resolved spontaneously.  She had some left-sided flank and back pain with radiation into the left lower abdomen last week.  She again had associated dysuria.  She had a urinalysis done last week which was unremarkable except for trace leukocytes.  Her symptoms have resolved.  She is currently at her baseline from a urinary standpoint.  No prior history of kidney stones.  No imaging studies have been performed.   Portions of the above documentation were copied from a prior visit for review purposes only.   Past Medical History:  Past Medical History:  Diagnosis Date   Arthritis knees, hips, hands, feet    Chronic joint pain due to arthritis   DDD (degenerative disc disease), cervical    Frequency of urination    GERD (gastroesophageal reflux disease)    H/O hiatal hernia    Hypertension    IBS (irritable bowel syndrome)    Nocturia    Pelvic pain in female    Urgency of urination     Past Surgical History:  Past Surgical History:  Procedure Laterality Date   ABDOMINAL HYSTERECTOMY  AGE 76   AND APPENDECTOMY (PELVIC  PROLAPSE AND ENDOMETRIOSIS)   BASAL CELL CARCINOMA EXCISION  2009   FOREHEAD   CARDIOVASCULAR STRESS TEST  03-11-2010  DR KATZ   NORMAL NUCLEAR STUDY/ EF 70%   CHOLECYSTECTOMY  1992   CYSTO WITH HYDRODISTENSION  07/08/2012   Procedure: CYSTOSCOPY/HYDRODISTENSION;  Surgeon: Martina Sinner, MD;  Location: [facility];  Service: Urology;  Laterality: N/A;  cysto, hod and instillation m&p     DILATION AND CURETTAGE OF UTERUS  X3   LAPAROTOMY BILATERAL SALPINGOOPHECTOMY AND  MARSHALL-MARCHETTI-KRANTZ PROCEDURE  AGE 71   URINARY INCONTINENCE   TONSILLECTOMY AND ADENOIDECTOMY  CHILD   TRANSTHORACIC ECHOCARDIOGRAM  03-01-2010   MILD LVH/ LVSF NORMAL/ EF 55-60%    Allergies:  Allergies  Allergen Reactions   Azithromycin Nausea And Vomiting    Severe n/v   Codeine Nausea And Vomiting   Levofloxacin Nausea And Vomiting    Severe n/v   Morphine Nausea And Vomiting    Severe n/v   Red Dye #40 (Allura Red) Hives   Synthroid [Levothyroxine Sodium] Other (See Comments)    "Caused tachycardia and the shakes"   Amoxicillin Rash   Benzonatate Palpitations    (tessalon) rapid heart beat    Caffeine Palpitations   Guaifenesin Palpitations    Rapid heart beat   Imipramine Palpitations    Rapid heart beat   Tetracycline Swelling and Rash    Family History:  Family History  Problem Relation Age of Onset   Hypertension Mother    Stroke Mother    Heart disease Mother    Cancer Father    Hypertension Father    Cancer Brother    Cancer Paternal Aunt     Social History:  Social History   Tobacco Use   Smoking status: Never   Smokeless tobacco: Never  Vaping Use   Vaping status: Never Used  Substance Use Topics   Alcohol use: No   Drug use: No    ROS: Constitutional:  Negative for fever, chills, weight loss CV: Negative for chest pain, previous MI, hypertension Respiratory:  Negative for shortness of breath, wheezing, sleep apnea, frequent cough GI:  Negative for nausea, vomiting, bloody stool, GERD  Physical exam: BP 123/79   Pulse 93   Ht 5' (1.524 m)   Wt 135 lb (61.2 kg)   BMI 26.37 kg/m  GENERAL APPEARANCE:  Well appearing, well developed, well nourished, NAD HEENT:  Atraumatic, normocephalic, oropharynx clear NECK:  Supple without lymphadenopathy or thyromegaly ABDOMEN:  Soft, non-tender, no masses EXTREMITIES:  Moves all extremities well, without clubbing, cyanosis, or edema NEUROLOGIC:  Alert and oriented x 3, normal gait, CN  II-XII grossly intact MENTAL STATUS:  appropriate BACK:  Non-tender to palpation, No CVAT SKIN:  Warm, dry, and intact  Results: U/A: negative

## 2023-09-07 ENCOUNTER — Ambulatory Visit (HOSPITAL_BASED_OUTPATIENT_CLINIC_OR_DEPARTMENT_OTHER)
Admission: RE | Admit: 2023-09-07 | Discharge: 2023-09-07 | Disposition: A | Discharge status: Home / Self Care | Payer: Medicare PPO | Source: Ambulatory Visit | Attending: Urology | Admitting: Urology

## 2023-09-07 DIAGNOSIS — R109 Unspecified abdominal pain: Secondary | ICD-10-CM | POA: Diagnosis present

## 2024-12-15 ENCOUNTER — Ambulatory Visit: Admitting: Urology

## 2024-12-15 NOTE — Progress Notes (Unsigned)
 "  Assessment: 1. History of UTI   2. Interstitial cystitis     Plan: Continue timed and double voiding to ensure bladder emptying Recommend further evaluation with CT renal stone study for evaluation of her intermittent flank pain. Will contact her with results. Continue bladder diet.  Chief Complaint:  No chief complaint on file.   History of Present Illness:  Tamara Aguilar is a 79 y.o. female who is seen for further evaluation of lower urinary tract symptoms and bladder pain.   She has previously seen Dr. Gaston and was diagnosed with chronic interstitial cystitis. She had cystoscopy with hydrodistention with bladder instillation in August 2013.    Her symptoms had improved at follow-up in October 2013.  She did not follow-up after that visit.  She reported that she was doing fairly well until April 2024 when she noted an increase in her urinary symptoms.  She had urinary frequency, voiding every 2 hours, nocturia every 3 hours, some urgency, and dysuria.  She also has discomfort in the suprapubic area and the bilateral pelvic area.  She has a history of stress and urge incontinence.  She has a also noted low back pain.  No gross hematuria.   Urine culture from 01/27/2023 grew 25-50 K mixed flora.  She was treated with several rounds of antibiotics without improvement in her symptoms. CT imaging from 03/17/2023 showed a large hiatal hernia, mild perinephric stranding bilaterally, normal-appearing bladder. She had been taking AZO with improvement in her symptoms.  She reports that she is adhering to the IC diet. PVR 4/24: 216 ml  She was given a trial of Myrbetriq  25 mg daily at her visit in April 2024. She was unable to tolerate the Myrbetriq  due to nausea and vomiting. At her visit in 5/24, she reported decreased bladder pain.  She continued with urinary frequency, occasional urgency, occasional dysuria and incontinence.  She was taking her hydrochlorothiazide in divided doses  which allowed her to empty the bladder more completely.  At her visit in October 2024, she reported 2 episodes of some flank and back pain within the past several months.  Her initial episode occurred on the right side.  She had right-sided back and flank pain with radiation into the right lower abdomen.  She also had associated dysuria.  Her symptoms resolved spontaneously.  She had some left-sided flank and back pain with radiation into the left lower abdomen last week.  She again had associated dysuria.  She had a urinalysis which was unremarkable except for trace leukocytes.  Her symptoms resolved.  She was at her baseline from a urinary standpoint.  No prior history of kidney stones.  No imaging studies have been performed. CT renal stone study from 10/24 showed no renal or ureteral calculi and no evidence of obstruction.   Portions of the above documentation were copied from a prior visit for review purposes only.   Past Medical History:  Past Medical History:  Diagnosis Date   Arthritis knees, hips, hands, feet    Chronic joint pain due to arthritis   DDD (degenerative disc disease), cervical    Frequency of urination    GERD (gastroesophageal reflux disease)    H/O hiatal hernia    Hypertension    IBS (irritable bowel syndrome)    Nocturia    Pelvic pain in female    Urgency of urination     Past Surgical History:  Past Surgical History:  Procedure Laterality Date   ABDOMINAL HYSTERECTOMY  AGE 30   AND APPENDECTOMY (PELVIC PROLAPSE AND ENDOMETRIOSIS)   BASAL CELL CARCINOMA EXCISION  2009   FOREHEAD   CARDIOVASCULAR STRESS TEST  03-11-2010  DR KATZ   NORMAL NUCLEAR STUDY/ EF 70%   CHOLECYSTECTOMY  1992   CYSTO WITH HYDRODISTENSION  07/08/2012   Procedure: CYSTOSCOPY/HYDRODISTENSION;  Surgeon: Glendia DELENA Elizabeth, MD;  Location: [facility];  Service: Urology;  Laterality: N/A;  cysto, hod and instillation m&p     DILATION AND CURETTAGE OF UTERUS  X3    LAPAROTOMY BILATERAL SALPINGOOPHECTOMY AND MARSHALL-MARCHETTI-KRANTZ PROCEDURE  AGE 63   URINARY INCONTINENCE   TONSILLECTOMY AND ADENOIDECTOMY  CHILD   TRANSTHORACIC ECHOCARDIOGRAM  03-01-2010   MILD LVH/ LVSF NORMAL/ EF 55-60%    Allergies:  Allergies  Allergen Reactions   Azithromycin Nausea And Vomiting    Severe n/v   Codeine Nausea And Vomiting   Levofloxacin Nausea And Vomiting    Severe n/v   Morphine Nausea And Vomiting    Severe n/v   Red Dye #40 (Allura Red) Hives   Synthroid [Levothyroxine Sodium] Other (See Comments)    Caused tachycardia and the shakes   Amoxicillin Rash   Benzonatate Palpitations    (tessalon) rapid heart beat    Caffeine Palpitations   Guaifenesin Palpitations    Rapid heart beat   Imipramine Palpitations    Rapid heart beat   Tetracycline Swelling and Rash    Family History:  Family History  Problem Relation Age of Onset   Hypertension Mother    Stroke Mother    Heart disease Mother    Cancer Father    Hypertension Father    Cancer Brother    Cancer Paternal Aunt     Social History:  Social History   Tobacco Use   Smoking status: Never   Smokeless tobacco: Never  Vaping Use   Vaping status: Never Used  Substance Use Topics   Alcohol use: No   Drug use: No    ROS: Constitutional:  Negative for fever, chills, weight loss CV: Negative for chest pain, previous MI, hypertension Respiratory:  Negative for shortness of breath, wheezing, sleep apnea, frequent cough GI:  Negative for nausea, vomiting, bloody stool, GERD  Physical exam: There were no vitals taken for this visit. GENERAL APPEARANCE:  Well appearing, well developed, well nourished, NAD HEENT:  Atraumatic, normocephalic, oropharynx clear NECK:  Supple without lymphadenopathy or thyromegaly ABDOMEN:  Soft, non-tender, no masses EXTREMITIES:  Moves all extremities well, without clubbing, cyanosis, or edema NEUROLOGIC:  Alert and oriented x 3, normal gait, CN  II-XII grossly intact MENTAL STATUS:  appropriate BACK:  Non-tender to palpation, No CVAT SKIN:  Warm, dry, and intact  Results: U/A:  "
